# Patient Record
Sex: Male | Born: 1960 | Race: White | Hispanic: No | Marital: Single | Smoking: Current every day smoker
Health system: Southern US, Community
[De-identification: ages and names within clinical notes are randomized; demographics above are authoritative.]

## PROBLEM LIST (undated history)

## (undated) DIAGNOSIS — F1020 Alcohol dependence, uncomplicated: Secondary | ICD-10-CM

## (undated) DIAGNOSIS — F1920 Other psychoactive substance dependence, uncomplicated: Secondary | ICD-10-CM

## (undated) HISTORY — PX: CERVICAL SPINE SURGERY: SHX589

## (undated) HISTORY — PX: FOOT SURGERY: SHX648

## (undated) HISTORY — PX: WRIST ARTHROPLASTY: SHX1088

## (undated) HISTORY — PX: KNEE ARTHROSCOPY: SUR90

---

## 2013-05-26 DIAGNOSIS — K8010 Calculus of gallbladder with chronic cholecystitis without obstruction: Principal | ICD-10-CM | POA: Insufficient documentation

## 2013-05-26 DIAGNOSIS — K7689 Other specified diseases of liver: Secondary | ICD-10-CM | POA: Insufficient documentation

## 2013-05-26 DIAGNOSIS — K429 Umbilical hernia without obstruction or gangrene: Secondary | ICD-10-CM | POA: Insufficient documentation

## 2013-05-26 DIAGNOSIS — F17200 Nicotine dependence, unspecified, uncomplicated: Secondary | ICD-10-CM | POA: Insufficient documentation

## 2013-05-26 DIAGNOSIS — Z981 Arthrodesis status: Secondary | ICD-10-CM | POA: Insufficient documentation

## 2013-05-26 DIAGNOSIS — K4090 Unilateral inguinal hernia, without obstruction or gangrene, not specified as recurrent: Secondary | ICD-10-CM | POA: Insufficient documentation

## 2013-05-27 ENCOUNTER — Observation Stay (HOSPITAL_COMMUNITY): Payer: No Typology Code available for payment source | Admitting: Certified Registered Nurse Anesthetist

## 2013-05-27 ENCOUNTER — Encounter (HOSPITAL_COMMUNITY): Payer: Self-pay | Admitting: Emergency Medicine

## 2013-05-27 ENCOUNTER — Encounter (HOSPITAL_COMMUNITY): Payer: No Typology Code available for payment source | Admitting: Certified Registered Nurse Anesthetist

## 2013-05-27 ENCOUNTER — Observation Stay (HOSPITAL_COMMUNITY): Payer: No Typology Code available for payment source

## 2013-05-27 ENCOUNTER — Emergency Department (HOSPITAL_COMMUNITY): Payer: No Typology Code available for payment source

## 2013-05-27 ENCOUNTER — Observation Stay (HOSPITAL_COMMUNITY)
Admission: EM | Admit: 2013-05-27 | Discharge: 2013-05-27 | Disposition: A | Discharge status: Home / Self Care | Payer: No Typology Code available for payment source | Attending: Surgery | Admitting: Surgery

## 2013-05-27 ENCOUNTER — Encounter (HOSPITAL_COMMUNITY)
Admission: EM | Disposition: A | Discharge status: Home / Self Care | Payer: Self-pay | Source: Home / Self Care | Attending: Emergency Medicine

## 2013-05-27 DIAGNOSIS — K7581 Nonalcoholic steatohepatitis (NASH): Secondary | ICD-10-CM | POA: Diagnosis present

## 2013-05-27 DIAGNOSIS — R112 Nausea with vomiting, unspecified: Secondary | ICD-10-CM

## 2013-05-27 DIAGNOSIS — K812 Acute cholecystitis with chronic cholecystitis: Secondary | ICD-10-CM

## 2013-05-27 DIAGNOSIS — K810 Acute cholecystitis: Secondary | ICD-10-CM

## 2013-05-27 DIAGNOSIS — K8020 Calculus of gallbladder without cholecystitis without obstruction: Secondary | ICD-10-CM

## 2013-05-27 DIAGNOSIS — R109 Unspecified abdominal pain: Secondary | ICD-10-CM

## 2013-05-27 DIAGNOSIS — K4090 Unilateral inguinal hernia, without obstruction or gangrene, not specified as recurrent: Secondary | ICD-10-CM

## 2013-05-27 DIAGNOSIS — K8010 Calculus of gallbladder with chronic cholecystitis without obstruction: Secondary | ICD-10-CM

## 2013-05-27 HISTORY — DX: Alcohol dependence, uncomplicated: F10.20

## 2013-05-27 HISTORY — PX: CHOLECYSTECTOMY: SHX55

## 2013-05-27 HISTORY — DX: Other psychoactive substance dependence, uncomplicated: F19.20

## 2013-05-27 LAB — CBC WITH DIFFERENTIAL/PLATELET
BASOS ABS: 0.1 10*3/uL (ref 0.0–0.1)
BASOS PCT: 0 % (ref 0–1)
Eosinophils Absolute: 0.1 10*3/uL (ref 0.0–0.7)
Eosinophils Relative: 1 % (ref 0–5)
HEMATOCRIT: 49.3 % (ref 39.0–52.0)
Hemoglobin: 17.8 g/dL — ABNORMAL HIGH (ref 13.0–17.0)
LYMPHS PCT: 9 % — AB (ref 12–46)
Lymphs Abs: 1.7 10*3/uL (ref 0.7–4.0)
MCH: 31 pg (ref 26.0–34.0)
MCHC: 36.1 g/dL — AB (ref 30.0–36.0)
MCV: 85.7 fL (ref 78.0–100.0)
MONO ABS: 0.6 10*3/uL (ref 0.1–1.0)
Monocytes Relative: 3 % (ref 3–12)
NEUTROS ABS: 15.8 10*3/uL — AB (ref 1.7–7.7)
NEUTROS PCT: 87 % — AB (ref 43–77)
Platelets: 297 10*3/uL (ref 150–400)
RBC: 5.75 MIL/uL (ref 4.22–5.81)
RDW: 14.1 % (ref 11.5–15.5)
WBC: 18.3 10*3/uL — AB (ref 4.0–10.5)

## 2013-05-27 LAB — URINALYSIS, ROUTINE W REFLEX MICROSCOPIC
Bilirubin Urine: NEGATIVE
Glucose, UA: NEGATIVE mg/dL
HGB URINE DIPSTICK: NEGATIVE
Ketones, ur: NEGATIVE mg/dL
Leukocytes, UA: NEGATIVE
Nitrite: NEGATIVE
PH: 5.5 (ref 5.0–8.0)
Protein, ur: NEGATIVE mg/dL
SPECIFIC GRAVITY, URINE: 1.021 (ref 1.005–1.030)
UROBILINOGEN UA: 0.2 mg/dL (ref 0.0–1.0)

## 2013-05-27 LAB — COMPREHENSIVE METABOLIC PANEL
ALT: 53 U/L (ref 0–53)
AST: 40 U/L — AB (ref 0–37)
Albumin: 4.6 g/dL (ref 3.5–5.2)
Alkaline Phosphatase: 80 U/L (ref 39–117)
BILIRUBIN TOTAL: 0.3 mg/dL (ref 0.3–1.2)
BUN: 13 mg/dL (ref 6–23)
CHLORIDE: 102 meq/L (ref 96–112)
CO2: 22 meq/L (ref 19–32)
CREATININE: 0.98 mg/dL (ref 0.50–1.35)
Calcium: 9.9 mg/dL (ref 8.4–10.5)
GFR calc Af Amer: >90 mL/min (ref >90)
GFR calc non Af Amer: >90 mL/min (ref >90)
Glucose, Bld: 138 mg/dL — ABNORMAL HIGH (ref 70–99)
POTASSIUM: 4.2 meq/L (ref 3.7–5.3)
Sodium: 142 mEq/L (ref 137–147)
Total Protein: 7.7 g/dL (ref 6.0–8.3)

## 2013-05-27 LAB — LIPASE, BLOOD: Lipase: 55 U/L (ref 11–59)

## 2013-05-27 LAB — I-STAT TROPONIN, ED: TROPONIN I, POC: 0.01 ng/mL (ref 0.00–0.08)

## 2013-05-27 LAB — SURGICAL PCR SCREEN
MRSA, PCR: NEGATIVE
Staphylococcus aureus: POSITIVE — AB

## 2013-05-27 SURGERY — LAPAROSCOPIC CHOLECYSTECTOMY WITH INTRAOPERATIVE CHOLANGIOGRAM
Anesthesia: General

## 2013-05-27 MED ORDER — ROCURONIUM BROMIDE 100 MG/10ML IV SOLN
INTRAVENOUS | Status: AC
  Filled 2013-05-27: qty 1

## 2013-05-27 MED ORDER — TRAMADOL HCL 50 MG PO TABS: 50.0000 mg | ORAL_TABLET | Freq: Four times a day (QID) | ORAL | Status: DC | PRN

## 2013-05-27 MED ORDER — LIDOCAINE HCL (CARDIAC) 20 MG/ML IV SOLN
INTRAVENOUS | Status: DC | PRN
  Administered 2013-05-27: 100 mg via INTRAVENOUS

## 2013-05-27 MED ORDER — SUCCINYLCHOLINE CHLORIDE 20 MG/ML IJ SOLN
INTRAMUSCULAR | Status: DC | PRN
  Administered 2013-05-27: 140 mg via INTRAVENOUS

## 2013-05-27 MED ORDER — ONDANSETRON HCL 4 MG/2ML IJ SOLN: 4.0000 mg | Freq: Four times a day (QID) | INTRAMUSCULAR | Status: DC | PRN

## 2013-05-27 MED ORDER — ONDANSETRON HCL 4 MG/2ML IJ SOLN
INTRAMUSCULAR | Status: AC
  Filled 2013-05-27: qty 2

## 2013-05-27 MED ORDER — LIP MEDEX EX OINT
1.0000 "application " | TOPICAL_OINTMENT | Freq: Two times a day (BID) | CUTANEOUS | Status: DC
  Filled 2013-05-27: qty 7

## 2013-05-27 MED ORDER — ONDANSETRON HCL 4 MG/2ML IJ SOLN
INTRAMUSCULAR | Status: DC | PRN
  Administered 2013-05-27: 4 mg via INTRAVENOUS

## 2013-05-27 MED ORDER — LACTATED RINGERS IR SOLN
Status: DC | PRN
  Administered 2013-05-27 (×2): 300 mL

## 2013-05-27 MED ORDER — MEPERIDINE HCL 50 MG/ML IJ SOLN: 6.2500 mg | INTRAMUSCULAR | Status: DC | PRN

## 2013-05-27 MED ORDER — SACCHAROMYCES BOULARDII 250 MG PO CAPS
250.0000 mg | ORAL_CAPSULE | Freq: Two times a day (BID) | ORAL | Status: DC
  Filled 2013-05-27 (×2): qty 1

## 2013-05-27 MED ORDER — GLYCOPYRROLATE 0.2 MG/ML IJ SOLN
INTRAMUSCULAR | Status: DC | PRN
  Administered 2013-05-27: .8 mg via INTRAVENOUS

## 2013-05-27 MED ORDER — IOHEXOL 300 MG/ML  SOLN
INTRAMUSCULAR | Status: DC | PRN
Start: 2013-05-27 — End: 2013-05-27
  Administered 2013-05-27: 10 mL

## 2013-05-27 MED ORDER — DEXAMETHASONE SODIUM PHOSPHATE 10 MG/ML IJ SOLN
INTRAMUSCULAR | Status: DC | PRN
  Administered 2013-05-27: 10 mg via INTRAVENOUS

## 2013-05-27 MED ORDER — PROPOFOL 10 MG/ML IV BOLUS
INTRAVENOUS | Status: DC | PRN
  Administered 2013-05-27: 200 mg via INTRAVENOUS

## 2013-05-27 MED ORDER — ONDANSETRON HCL 4 MG/2ML IJ SOLN
4.0000 mg | Freq: Once | INTRAMUSCULAR | Status: AC
  Administered 2013-05-27: 4 mg via INTRAVENOUS
  Filled 2013-05-27: qty 2

## 2013-05-27 MED ORDER — GLYCOPYRROLATE 0.2 MG/ML IJ SOLN
INTRAMUSCULAR | Status: AC
  Filled 2013-05-27: qty 3

## 2013-05-27 MED ORDER — LACTATED RINGERS IV SOLN
INTRAVENOUS | Status: DC
  Administered 2013-05-27: 1000 mL via INTRAVENOUS

## 2013-05-27 MED ORDER — DEXAMETHASONE SODIUM PHOSPHATE 10 MG/ML IJ SOLN
INTRAMUSCULAR | Status: AC
  Filled 2013-05-27: qty 1

## 2013-05-27 MED ORDER — OXYCODONE HCL 5 MG/5ML PO SOLN
5.0000 mg | Freq: Once | ORAL | Status: DC | PRN
  Filled 2013-05-27: qty 5

## 2013-05-27 MED ORDER — KCL IN DEXTROSE-NACL 40-5-0.45 MEQ/L-%-% IV SOLN
INTRAVENOUS | Status: DC
  Administered 2013-05-27: 08:00:00 via INTRAVENOUS
  Filled 2013-05-27: qty 1000

## 2013-05-27 MED ORDER — KETAMINE HCL 10 MG/ML IJ SOLN
INTRAMUSCULAR | Status: AC
  Filled 2013-05-27: qty 1

## 2013-05-27 MED ORDER — MIDAZOLAM HCL 2 MG/2ML IJ SOLN
INTRAMUSCULAR | Status: AC
  Filled 2013-05-27: qty 2

## 2013-05-27 MED ORDER — ALUM & MAG HYDROXIDE-SIMETH 200-200-20 MG/5ML PO SUSP: 30.0000 mL | Freq: Four times a day (QID) | ORAL | Status: DC | PRN

## 2013-05-27 MED ORDER — BUPIVACAINE-EPINEPHRINE 0.25% -1:200000 IJ SOLN
INTRAMUSCULAR | Status: AC
  Filled 2013-05-27: qty 1

## 2013-05-27 MED ORDER — FENTANYL CITRATE 0.05 MG/ML IJ SOLN: 25.0000 ug | INTRAMUSCULAR | Status: DC | PRN

## 2013-05-27 MED ORDER — KETOROLAC TROMETHAMINE 30 MG/ML IJ SOLN
INTRAMUSCULAR | Status: AC
  Filled 2013-05-27: qty 1

## 2013-05-27 MED ORDER — TRAMADOL HCL 50 MG PO TABS
50.0000 mg | ORAL_TABLET | Freq: Once | ORAL | Status: AC
  Administered 2013-05-27: 50 mg via ORAL
  Filled 2013-05-27: qty 1

## 2013-05-27 MED ORDER — ACETAMINOPHEN 650 MG RE SUPP: 650.0000 mg | Freq: Four times a day (QID) | RECTAL | Status: DC | PRN

## 2013-05-27 MED ORDER — FENTANYL CITRATE 0.05 MG/ML IJ SOLN
INTRAMUSCULAR | Status: AC
  Filled 2013-05-27: qty 2

## 2013-05-27 MED ORDER — ACETAMINOPHEN 10 MG/ML IV SOLN
1000.0000 mg | Freq: Once | INTRAVENOUS | Status: AC
  Administered 2013-05-27: 1000 mg via INTRAVENOUS
  Filled 2013-05-27: qty 100

## 2013-05-27 MED ORDER — ACETAMINOPHEN 325 MG PO TABS: 650.0000 mg | ORAL_TABLET | Freq: Four times a day (QID) | ORAL | Status: DC | PRN

## 2013-05-27 MED ORDER — NEOSTIGMINE METHYLSULFATE 1 MG/ML IJ SOLN
INTRAMUSCULAR | Status: AC
  Filled 2013-05-27: qty 10

## 2013-05-27 MED ORDER — DIPHENHYDRAMINE HCL 50 MG/ML IJ SOLN: 12.5000 mg | Freq: Four times a day (QID) | INTRAMUSCULAR | Status: DC | PRN

## 2013-05-27 MED ORDER — FENTANYL CITRATE 0.05 MG/ML IJ SOLN
INTRAMUSCULAR | Status: AC
  Filled 2013-05-27: qty 5

## 2013-05-27 MED ORDER — BUPIVACAINE-EPINEPHRINE 0.25% -1:200000 IJ SOLN
INTRAMUSCULAR | Status: DC | PRN
  Administered 2013-05-27: 27 mL

## 2013-05-27 MED ORDER — NEOSTIGMINE METHYLSULFATE 1 MG/ML IJ SOLN
INTRAMUSCULAR | Status: DC | PRN
  Administered 2013-05-27: 5 mg via INTRAVENOUS

## 2013-05-27 MED ORDER — OXYCODONE HCL 5 MG PO TABS: 5.0000 mg | ORAL_TABLET | Freq: Once | ORAL | Status: DC | PRN

## 2013-05-27 MED ORDER — CHLORHEXIDINE GLUCONATE CLOTH 2 % EX PADS: 6.0000 | MEDICATED_PAD | Freq: Every day | CUTANEOUS | Status: DC

## 2013-05-27 MED ORDER — ESMOLOL HCL 10 MG/ML IV SOLN
INTRAVENOUS | Status: AC
Start: 2013-05-27 — End: 2013-05-27
  Filled 2013-05-27: qty 10

## 2013-05-27 MED ORDER — LIDOCAINE HCL (CARDIAC) 20 MG/ML IV SOLN
INTRAVENOUS | Status: AC
  Filled 2013-05-27: qty 5

## 2013-05-27 MED ORDER — HYDROMORPHONE HCL PF 1 MG/ML IJ SOLN: 0.2500 mg | INTRAMUSCULAR | Status: DC | PRN

## 2013-05-27 MED ORDER — KETOROLAC TROMETHAMINE 30 MG/ML IJ SOLN
30.0000 mg | Freq: Once | INTRAMUSCULAR | Status: AC
  Administered 2013-05-27: 30 mg via INTRAVENOUS
  Filled 2013-05-27: qty 1

## 2013-05-27 MED ORDER — SODIUM CHLORIDE 0.9 % IV SOLN
3.0000 g | Freq: Four times a day (QID) | INTRAVENOUS | Status: DC
  Administered 2013-05-27 (×2): 3 g via INTRAVENOUS
  Filled 2013-05-27 (×5): qty 3

## 2013-05-27 MED ORDER — ESMOLOL HCL 10 MG/ML IV SOLN
INTRAVENOUS | Status: DC | PRN
  Administered 2013-05-27: 20 mg via INTRAVENOUS
  Administered 2013-05-27: 10 mg via INTRAVENOUS
  Administered 2013-05-27: 20 mg via INTRAVENOUS
  Administered 2013-05-27: 10 mg via INTRAVENOUS
  Administered 2013-05-27: 30 mg via INTRAVENOUS
  Administered 2013-05-27: 10 mg via INTRAVENOUS

## 2013-05-27 MED ORDER — HEPARIN SODIUM (PORCINE) 5000 UNIT/ML IJ SOLN
5000.0000 [IU] | Freq: Three times a day (TID) | INTRAMUSCULAR | Status: DC
  Filled 2013-05-27: qty 1

## 2013-05-27 MED ORDER — LACTATED RINGERS IV BOLUS (SEPSIS)
1000.0000 mL | Freq: Once | INTRAVENOUS | Status: DC
Start: 2013-05-27 — End: 2013-05-27

## 2013-05-27 MED ORDER — PROPOFOL 10 MG/ML IV BOLUS
INTRAVENOUS | Status: AC
  Filled 2013-05-27: qty 20

## 2013-05-27 MED ORDER — MAGIC MOUTHWASH
15.0000 mL | Freq: Four times a day (QID) | ORAL | Status: DC | PRN
  Filled 2013-05-27: qty 15

## 2013-05-27 MED ORDER — KETAMINE HCL 10 MG/ML IJ SOLN
INTRAMUSCULAR | Status: DC | PRN
  Administered 2013-05-27: 30 mg via INTRAVENOUS

## 2013-05-27 MED ORDER — PROMETHAZINE HCL 25 MG/ML IJ SOLN
6.2500 mg | INTRAMUSCULAR | Status: DC | PRN
Start: 2013-05-27 — End: 2013-05-27

## 2013-05-27 MED ORDER — KETOROLAC TROMETHAMINE 15 MG/ML IJ SOLN: 15.0000 mg | Freq: Four times a day (QID) | INTRAMUSCULAR | Status: DC | PRN

## 2013-05-27 MED ORDER — KETOROLAC TROMETHAMINE 30 MG/ML IJ SOLN
INTRAMUSCULAR | Status: DC | PRN
  Administered 2013-05-27: 30 mg via INTRAVENOUS

## 2013-05-27 MED ORDER — BISACODYL 10 MG RE SUPP: 10.0000 mg | Freq: Two times a day (BID) | RECTAL | Status: DC | PRN

## 2013-05-27 MED ORDER — ROCURONIUM BROMIDE 100 MG/10ML IV SOLN
INTRAVENOUS | Status: DC | PRN
  Administered 2013-05-27: 40 mg via INTRAVENOUS

## 2013-05-27 MED ORDER — POLYETHYLENE GLYCOL 3350 17 G PO PACK
17.0000 g | PACK | Freq: Two times a day (BID) | ORAL | Status: DC
  Filled 2013-05-27 (×2): qty 1

## 2013-05-27 MED ORDER — DIPHENHYDRAMINE HCL 12.5 MG/5ML PO ELIX: 12.5000 mg | ORAL_SOLUTION | Freq: Four times a day (QID) | ORAL | Status: DC | PRN

## 2013-05-27 MED ORDER — LACTATED RINGERS IV BOLUS (SEPSIS): 1000.0000 mL | Freq: Three times a day (TID) | INTRAVENOUS | Status: DC | PRN

## 2013-05-27 MED ORDER — SODIUM CHLORIDE 0.9 % IV BOLUS (SEPSIS)
1000.0000 mL | Freq: Once | INTRAVENOUS | Status: AC
  Administered 2013-05-27: 1000 mL via INTRAVENOUS

## 2013-05-27 MED ORDER — PROMETHAZINE HCL 25 MG/ML IJ SOLN
6.2500 mg | Freq: Four times a day (QID) | INTRAMUSCULAR | Status: DC | PRN
  Filled 2013-05-27: qty 1

## 2013-05-27 MED ORDER — KCL IN DEXTROSE-NACL 20-5-0.45 MEQ/L-%-% IV SOLN
INTRAVENOUS | Status: DC
  Administered 2013-05-27: 07:00:00 via INTRAVENOUS
  Filled 2013-05-27 (×3): qty 1000

## 2013-05-27 MED ORDER — MUPIROCIN 2 % EX OINT
1.0000 "application " | TOPICAL_OINTMENT | Freq: Two times a day (BID) | CUTANEOUS | Status: DC
  Administered 2013-05-27: 1 via NASAL
  Filled 2013-05-27: qty 22

## 2013-05-27 MED ORDER — METOPROLOL TARTRATE 12.5 MG HALF TABLET
12.5000 mg | ORAL_TABLET | Freq: Two times a day (BID) | ORAL | Status: DC | PRN
  Filled 2013-05-27: qty 1

## 2013-05-27 SURGICAL SUPPLY — 41 items
ADH SKN CLS APL DERMABOND .7 (GAUZE/BANDAGES/DRESSINGS) ×1
APPLIER CLIP ROT 10 11.4 M/L (STAPLE) ×3
APR CLP MED LRG 11.4X10 (STAPLE) ×1
BAG SPEC RTRVL LRG 6X4 10 (ENDOMECHANICALS) ×1
CANISTER SUCTION 2500CC (MISCELLANEOUS) ×1 IMPLANT
CATH REDDICK CHOLANGI 4FR 50CM (CATHETERS) ×3 IMPLANT
CHLORAPREP W/TINT 26ML (MISCELLANEOUS) ×3 IMPLANT
CLIP APPLIE ROT 10 11.4 M/L (STAPLE) ×1 IMPLANT
COVER MAYO STAND STRL (DRAPES) ×3 IMPLANT
DECANTER SPIKE VIAL GLASS SM (MISCELLANEOUS) ×3 IMPLANT
DERMABOND ADVANCED (GAUZE/BANDAGES/DRESSINGS) ×2
DERMABOND ADVANCED .7 DNX12 (GAUZE/BANDAGES/DRESSINGS) ×1 IMPLANT
DRAPE C-ARM 42X120 X-RAY (DRAPES) ×3 IMPLANT
DRAPE LAPAROSCOPIC ABDOMINAL (DRAPES) ×3 IMPLANT
ELECT REM PT RETURN 9FT ADLT (ELECTROSURGICAL) ×3
ELECTRODE REM PT RTRN 9FT ADLT (ELECTROSURGICAL) ×1 IMPLANT
GLOVE BIO SURGEON STRL SZ7.5 (GLOVE) ×6 IMPLANT
GLOVE BIOGEL PI IND STRL 6.5 (GLOVE) IMPLANT
GLOVE BIOGEL PI INDICATOR 6.5 (GLOVE) ×8
GOWN PREVENTION PLUS XLARGE (GOWN DISPOSABLE) ×2 IMPLANT
GOWN STRL NON-REIN LRG LVL3 (GOWN DISPOSABLE) ×2 IMPLANT
GOWN STRL REIN XL XLG (GOWN DISPOSABLE) ×2 IMPLANT
GOWN STRL REUS W/ TWL XL LVL3 (GOWN DISPOSABLE) IMPLANT
GOWN STRL REUS W/TWL LRG LVL3 (GOWN DISPOSABLE) ×3 IMPLANT
GOWN STRL REUS W/TWL XL LVL3 (GOWN DISPOSABLE) ×8 IMPLANT
HEMOSTAT SURGICEL 4X8 (HEMOSTASIS) ×3 IMPLANT
IV CATH 14GX2 1/4 (CATHETERS) ×3 IMPLANT
KIT BASIN OR (CUSTOM PROCEDURE TRAY) ×3 IMPLANT
MANIFOLD NEPTUNE II (INSTRUMENTS) ×2 IMPLANT
POUCH SPECIMEN RETRIEVAL 10MM (ENDOMECHANICALS) ×2 IMPLANT
SET IRRIG TUBING LAPAROSCOPIC (IRRIGATION / IRRIGATOR) ×3 IMPLANT
SOLUTION ANTI FOG 6CC (MISCELLANEOUS) ×3 IMPLANT
SUT MNCRL AB 4-0 PS2 18 (SUTURE) ×3 IMPLANT
TOWEL OR 17X26 10 PK STRL BLUE (TOWEL DISPOSABLE) ×3 IMPLANT
TOWEL OR NON WOVEN STRL DISP B (DISPOSABLE) ×3 IMPLANT
TRAY LAP CHOLE (CUSTOM PROCEDURE TRAY) ×3 IMPLANT
TROCAR BLADELESS OPT 5 75 (ENDOMECHANICALS) ×3 IMPLANT
TROCAR SLEEVE XCEL 5X75 (ENDOMECHANICALS) ×3 IMPLANT
TROCAR XCEL BLUNT TIP 100MML (ENDOMECHANICALS) ×3 IMPLANT
TROCAR XCEL NON-BLD 11X100MML (ENDOMECHANICALS) ×3 IMPLANT
TUBING INSUFFLATION 10FT LAP (TUBING) ×3 IMPLANT

## 2013-05-27 NOTE — Anesthesia Preprocedure Evaluation (Addendum)
Anesthesia Evaluation  Patient identified by MRN, date of birth, ID band Patient awake    Reviewed: Allergy & Precautions, H&P , NPO status , Patient's Chart, lab work & pertinent test results  Airway Mallampati: II TM Distance: >3 FB     Dental  (+) Dental Advisory Given   Pulmonary neg pulmonary ROS, Current Smoker,  breath sounds clear to auscultation        Cardiovascular negative cardio ROS  Rhythm:Regular Rate:Normal     Neuro/Psych negative neurological ROS  negative psych ROS   GI/Hepatic negative GI ROS, Neg liver ROS, (+)     substance abuse  alcohol use,   Endo/Other  negative endocrine ROS  Renal/GU negative Renal ROS     Musculoskeletal negative musculoskeletal ROS (+)   Abdominal   Peds  Hematology negative hematology ROS (+)   Anesthesia Other Findings   Reproductive/Obstetrics negative OB ROS                          Anesthesia Physical Anesthesia Plan  ASA: II  Anesthesia Plan: General   Post-op Pain Management:    Induction: Intravenous  Airway Management Planned: Oral ETT  Additional Equipment:   Intra-op Plan:   Post-operative Plan: Extubation in OR  Informed Consent: I have reviewed the patients History and Physical, chart, labs and discussed the procedure including the risks, benefits and alternatives for the proposed anesthesia with the patient or authorized representative who has indicated his/her understanding and acceptance.   Dental advisory given  Plan Discussed with: CRNA  Anesthesia Plan Comments:         Anesthesia Quick Evaluation

## 2013-05-27 NOTE — ED Notes (Signed)
Pt states that he started having Right and Left upper quadrant pain, vomiting and back pain approx. 5 hours ago. States that he has been told in the past that he had gallstones but has never had trouble with them.

## 2013-05-27 NOTE — Progress Notes (Signed)
  Subjective: No complaints now. He feels much better than earlier  Objective: Vital signs in last 24 hours: Temp:  [97.5 F (36.4 C)-97.7 F (36.5 C)] 97.7 F (36.5 C) (03/24 0818) Pulse Rate:  [50-64] 64 (03/24 0818) Resp:  [16-20] 18 (03/24 0818) BP: (123-170)/(59-101) 130/78 mmHg (03/24 0818) SpO2:  [95 %-98 %] 96 % (03/24 0818) Weight:  [235 lb (106.595 kg)] 235 lb (106.595 kg) (03/24 0818)    Intake/Output from previous day:   Intake/Output this shift:    Resp: clear to auscultation bilaterally Cardio: regular rate and rhythm GI: soft, nontender  Lab Results:   Recent Labs  05/27/13 0038  WBC 18.3*  HGB 17.8*  HCT 49.3  PLT 297   BMET  Recent Labs  05/27/13 0038  NA 142  K 4.2  CL 102  CO2 22  GLUCOSE 138*  BUN 13  CREATININE 0.98  CALCIUM 9.9   PT/INR No results found for this basename: LABPROT, INR,  in the last 72 hours ABG No results found for this basename: PHART, PCO2, PO2, HCO3,  in the last 72 hours  Studies/Results: [place] Abdomen Limited  05/27/2013   CLINICAL DATA:  Right upper quadrant abdominal pain.  Emesis.  EXAM: US ABDOMEN LIMITED - RIGHT UPPER QUADRANT  COMPARISON:  No priors.  FINDINGS: Gallbladder:  Multiple echogenic foci with posterior acoustic shadowing wing dependently within the gallbladder, compatible with gallstones, largest of which measures up to 1.5 cm in diameter. Gallbladder wall thickness is normal at 2 to 3 mm. Gallbladder appears only moderately distended. No abnormal pericholecystic fluid. Per report from the sonographer, the patient did not exhibit a sonographic Murphy's sign on examination.  Common bile duct:  Diameter: 4 mm in the porta hepatis.  Liver:  Diffusely increased echogenicity throughout the hepatic parenchyma, suggestive of hepatic steatosis. No focal cystic or solid hepatic lesions. No intrahepatic biliary ductal dilatation. Normal hepatopetal flow in the portal vein.  IMPRESSION: 1. Study is positive for  cholelithiasis, but there are no findings to suggest acute cholecystitis at this time. 2. Probable hepatic steatosis.   Electronically Signed   By: Trudie Reedaniel  Entrikin M.D.   On: 05/27/2013 05:14    Anti-infectives: Anti-infectives   Start     Dose/Rate Route Frequency Ordered Stop   05/27/13 0800  Ampicillin-Sulbactam (UNASYN) 3 g in sodium chloride 0.9 % 100 mL IVPB     3 g 100 mL/hr over 60 Minutes Intravenous Every 6 hours 05/27/13 0708        Assessment/Plan: s/p Procedure(s): LAPAROSCOPIC CHOLECYSTECTOMY WITH INTRAOPERATIVE CHOLANGIOGRAM (N/A) plan for lap chole later today. Risks and benefits of surgery discussed with the pt as well as some of the technical aspects and he understands and wishes to proceed  LOS: 0 days    TOTH III,Taiwo Fish S 05/27/2013

## 2013-05-27 NOTE — Progress Notes (Signed)
Pt arrived from PACU on bed, A&Ox4. Abd dermabond x 4 d/i. Denies pain/nausea at present. VSS. IVF infusing. Pt has d/c orders for home. Orders clarified w/ Dr Abbey Chattersosenbower.  Pt d/c home after he has tolerated sips of clear liquids, has voided, and has ambulated. Pt aware and agreeable. Sister at bedside.

## 2013-05-27 NOTE — ED Notes (Signed)
Pt aware of the need for a urine sample, urinal at bedside. 

## 2013-05-27 NOTE — Transfer of Care (Signed)
Immediate Anesthesia Transfer of Care Note  Patient: Shawn Hartman  Procedure(s) Performed: Procedure(s) (LRB): LAPAROSCOPIC CHOLECYSTECTOMY WITH INTRAOPERATIVE CHOLANGIOGRAM (N/A)  Patient Location: PACU  Anesthesia Type: General  Level of Consciousness: sedated, patient cooperative and responds to stimulation  Airway & Oxygen Therapy: Patient Spontanous Breathing and Patient connected to face mask oxgen  Post-op Assessment: Report given to PACU RN and Post -op Vital signs reviewed and stable  Post vital signs: Reviewed and stable  Complications: No apparent anesthesia complications

## 2013-05-27 NOTE — H&P (Addendum)
[place], MD, [address] [place], [place], [place] [address] Phone: [phone] FAX: [phone]     Yoltzin Barg  [birth year] [id number]  CARE TEAM:  PCP: No primary provider on file.  Outpatient Care Team: Patient has no care team.  Inpatient Treatment Team: Treatment Team: Attending Provider: Elyn Peers, MD; Technician: Sharma Covert, EMT; Registered Nurse: Susette Racer, RN; Registered Nurse: Waunita Schooner, RN; Consulting Physician: Nolon Nations, MD  This patient is a 53 y.o.male who presents today for surgical evaluation at the request of Dr Cheri Guppy, Dirk Dress ED MD.   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  Smokes half a pack a day.  No history of bowel problems.  Had an episode of severe upper abdominal pain and nausea vomiting last month.  Happened at night.  Resolved rather quickly.  Had another episode last night.  Far more intense.  No relief with ginger, Thoms, Pepto-Bismol.  [facility] emergency room.  Soreness and upper abdomen.  Initially started at back.  Primarily epigastric.  No hematemesis.  No sick contacts.  No travel history.   He normally has 2-3 bowel movements a day.  No change in bowel habits.  No personal nor family history of GI/colon cancer, inflammatory bowel disease, irritable bowel syndrome, allergy such as Celiac Sprue, dietary/dairy problems, colitis, ulcers nor gastritis.  No recent sick contacts/gastroenteritis.  No travel outside the country.  No changes in diet.  No dysphagia to solids or liquids.  No significant heartburn or reflux.  No hematochezia, hematemesis, coffee ground emesis.  No evidence of prior gastric/peptic ulceration.    No past medical history on file.  Past Surgical History  Procedure Laterality Date  . Wrist arthroplasty    . Cervical spine  surgery      C3-5 FUSED. bROKEN NECK  . Foot surgery Right   . Knee arthroscopy Bilateral     MULTIPLE    History   Social History  . Marital Status: Single    Spouse Name: N/A    Number of Children: N/A  . Years of Education: N/A   Occupational History  . Not on file.   Social History Main Topics  . Smoking status: Current Every Day Smoker -- 0.25 packs/day  . Smokeless tobacco: Not on file  . Alcohol Use: No     Comment: Recovering alcoholic  . Drug Use: No  . Sexual Activity: Not on file   Other Topics Concern  . Not on file   Social History Narrative  . No narrative on file    No family history on file.  No current facility-administered medications for this encounter.   No current outpatient prescriptions on file.     No Known Allergies  ROS: Constitutional:  No fevers, chills, sweats.  Weight stable Eyes:  No vision changes, No discharge HENT:  No sore throats, nasal drainage Lymph: No neck swelling, No bruising easily Pulmonary:  No cough, productive sputum CV: No orthopnea, PND  Patient walks 30 minutes for about 1 miles without difficulty.  No exertional chest/neck/shoulder/arm pain. GI: No personal nor family history of GI/colon cancer, inflammatory bowel disease, irritable bowel syndrome, allergy such as Celiac Sprue, dietary/dairy problems, colitis, ulcers nor gastritis.  No recent sick contacts/gastroenteritis.  No travel outside the country.  No changes in diet. Renal: No UTIs, No  hematuria Genital:  No drainage, bleeding, masses Musculoskeletal: No severe joint pain.  Good ROM major joints Skin:  No sores or lesions.  No rashes Heme/Lymph:  No easy bleeding.  No swollen lymph nodes Neuro: No focal weakness/numbness.  No seizures Psych: No suicidal ideation.  No hallucinations  BP 142/75  Pulse 50  Temp(Src) 97.5 F (36.4 C) (Oral)  Resp 16  SpO2 98%  Physical Exam: General: Pt awake/alert/oriented x4 in no major acute distress Eyes:  PERRL, normal EOM. Sclera nonicteric Neuro: CN II-XII intact w/o focal sensory/motor deficits. Lymph: No head/neck/groin lymphadenopathy Psych:  No delerium/psychosis/paranoia HENT: Normocephalic, Mucus membranes moist.  No thrush Neck: Supple, No tracheal deviation Chest: No pain.  Good respiratory excursion. CV:  Pulses intact.  Regular rhythm Abdomen: Soft, Nondistended.  Mild/mod TTP RUQ w Murphy's sign.  No incarcerated hernias. Ext:  SCDs BLE.  No significant edema.  No cyanosis Skin: No petechiae / purpurea.  No major sores Musculoskeletal: No severe joint pain.  Good ROM major joints   Results:   Labs: Results for orders placed during the hospital encounter of 05/27/13 (from the past 48 hour(s))  CBC WITH DIFFERENTIAL     Status: Abnormal   Collection Time    05/27/13 12:38 AM      Result Value Ref Range   WBC 18.3 (*) 4.0 - 10.5 K/uL   RBC 5.75  4.22 - 5.81 MIL/uL   Hemoglobin 17.8 (*) 13.0 - 17.0 g/dL   HCT 49.3  39.0 - 52.0 %   MCV 85.7  78.0 - 100.0 fL   MCH 31.0  26.0 - 34.0 pg   MCHC 36.1 (*) 30.0 - 36.0 g/dL   RDW 14.1  11.5 - 15.5 %   Platelets 297  150 - 400 K/uL   Neutrophils Relative % 87 (*) 43 - 77 %   Neutro Abs 15.8 (*) 1.7 - 7.7 K/uL   Lymphocytes Relative 9 (*) 12 - 46 %   Lymphs Abs 1.7  0.7 - 4.0 K/uL   Monocytes Relative 3  3 - 12 %   Monocytes Absolute 0.6  0.1 - 1.0 K/uL   Eosinophils Relative 1  0 - 5 %   Eosinophils Absolute 0.1  0.0 - 0.7 K/uL   Basophils Relative 0  0 - 1 %   Basophils Absolute 0.1  0.0 - 0.1 K/uL  COMPREHENSIVE METABOLIC PANEL     Status: Abnormal   Collection Time    05/27/13 12:38 AM      Result Value Ref Range   Sodium 142  137 - 147 mEq/L   Potassium 4.2  3.7 - 5.3 mEq/L   Chloride 102  96 - 112 mEq/L   CO2 22  19 - 32 mEq/L   Glucose, Bld 138 (*) 70 - 99 mg/dL   BUN 13  6 - 23 mg/dL   Creatinine, Ser 0.98  0.50 - 1.35 mg/dL   Calcium 9.9  8.4 - 10.5 mg/dL   Total Protein 7.7  6.0 - 8.3 g/dL   Albumin 4.6   3.5 - 5.2 g/dL   AST 40 (*) 0 - 37 U/L   Comment: SLIGHT HEMOLYSIS     HEMOLYSIS AT THIS LEVEL MAY AFFECT RESULT   ALT 53  0 - 53 U/L   Alkaline Phosphatase 80  39 - 117 U/L   Total Bilirubin 0.3  0.3 - 1.2 mg/dL   GFR calc non Af Amer >90  >90 mL/min   GFR calc Af  Amer >90  >90 mL/min   Comment: (NOTE)     The eGFR has been calculated using the CKD EPI equation.     This calculation has not been validated in all clinical situations.     eGFR's persistently <90 mL/min signify possible Chronic Kidney     Disease.  LIPASE, BLOOD     Status: None   Collection Time    05/27/13 12:38 AM      Result Value Ref Range   Lipase 55  11 - 59 U/L  I-STAT TROPOININ, ED     Status: None   Collection Time    05/27/13 12:43 AM      Result Value Ref Range   Troponin i, poc 0.01  0.00 - 0.08 ng/mL   Comment 3            Comment: Due to the release kinetics of cTnI,     a negative result within the first hours     of the onset of symptoms does not rule out     myocardial infarction with certainty.     If myocardial infarction is still suspected,     repeat the test at appropriate intervals.  URINALYSIS, ROUTINE W REFLEX MICROSCOPIC     Status: None   Collection Time    05/27/13  1:03 AM      Result Value Ref Range   Color, Urine YELLOW  YELLOW   APPearance CLEAR  CLEAR   Specific Gravity, Urine 1.021  1.005 - 1.030   pH 5.5  5.0 - 8.0   Glucose, UA NEGATIVE  NEGATIVE mg/dL   Hgb urine dipstick NEGATIVE  NEGATIVE   Bilirubin Urine NEGATIVE  NEGATIVE   Ketones, ur NEGATIVE  NEGATIVE mg/dL   Protein, ur NEGATIVE  NEGATIVE mg/dL   Urobilinogen, UA 0.2  0.0 - 1.0 mg/dL   Nitrite NEGATIVE  NEGATIVE   Leukocytes, UA NEGATIVE  NEGATIVE   Comment: MICROSCOPIC NOT DONE ON URINES WITH NEGATIVE PROTEIN, BLOOD, LEUKOCYTES, NITRITE, OR GLUCOSE <1000 mg/dL.    Imaging / Studies: US Abdomen Limited  05/27/2013   CLINICAL DATA:  Right upper quadrant abdominal pain.  Emesis.  EXAM: US ABDOMEN LIMITED -  RIGHT UPPER QUADRANT  COMPARISON:  No priors.  FINDINGS: Gallbladder:  Multiple echogenic foci with posterior acoustic shadowing wing dependently within the gallbladder, compatible with gallstones, largest of which measures up to 1.5 cm in diameter. Gallbladder wall thickness is normal at 2 to 3 mm. Gallbladder appears only moderately distended. No abnormal pericholecystic fluid. Per report from the sonographer, the patient did not exhibit a sonographic Murphy's sign on examination.  Common bile duct:  Diameter: 4 mm in the porta hepatis.  Liver:  Diffusely increased echogenicity throughout the hepatic parenchyma, suggestive of hepatic steatosis. No focal cystic or solid hepatic lesions. No intrahepatic biliary ductal dilatation. Normal hepatopetal flow in the portal vein.  IMPRESSION: 1. Study is positive for cholelithiasis, but there are no findings to suggest acute cholecystitis at this time. 2. Probable hepatic steatosis.   Electronically Signed   By: Vinnie Langton M.D.   On: 05/27/2013 05:14    Medications / Allergies: per chart  Antibiotics: Anti-infectives   None      Assessment  Shawn Hartman  53 y.o. male       Problem List:  Principal Problem:   Acute cholecystitis with chronic cholecystitis Active Problems:   Steatohepatitis   Inguinal hernia, right   Classic episode of biliary colic with persistent  pain suspicious for acute cholecystitis.  Rest of the differential diagnosis seems unlikely.  Plan:  Admit.  IV antibiotics.  Unison.  Progressive pain in nausea control.  Try to avoid narcotics per patient request given history of prior narcotic addiction.  Laparoscopic cholecystectomy with intraoperative cholangiogram.  I discussed with the patient in detail:  The anatomy & physiology of hepatobiliary & pancreatic function was discussed.  The pathophysiology of gallbladder dysfunction was discussed.  Natural history risks without surgery was discussed.   I feel the  risks of no intervention will lead to serious problems that outweigh the operative risks; therefore, I recommended cholecystectomy to remove the pathology.  I explained laparoscopic techniques with possible need for an open approach.  Probable cholangiogram to evaluate the bilary tract was explained as well.    Risks such as bleeding, infection, abscess, leak, injury to other organs, need for further treatment, heart attack, death, and other risks were discussed.  I noted a good likelihood this will help address the problem.  Possibility that this will not correct all abdominal symptoms was explained.  Goals of post-operative recovery were discussed as well.  We will work to minimize complications.  An educational handout further explaining the pathology and treatment options was given as well.  Questions were answered.  The patient expresses understanding & wishes to proceed with surgery.  VTE prophylaxis- SCDs, etc  mobilize as tolerated to help recovery  Attending colonoscopy since age greater than 57 to rule out polyps.  Focus on tobacco sensation.  STOP SMOKING! We talked to the patient about the dangers of smoking.  We stressed that tobacco use dramatically increases the risk of peri-operative complications such as infection, tissue necrosis leaving to problems with incision/wound and organ healing, hernia, chronic pain, heart attack, stroke, DVT, pulmonary embolism, and death.  We noted there are programs in our community to help stop smoking.  Information was available.  Radial hernia.  He would benefit from repair of this hernia with possible laparoscopic common contralateral side to rule out hernia on that side.  Not symptomatic at this point, social workers here for elective repair.  We are available should he desire elective repair w mesh at a later time.  None good time to repair an inguinal hernia with mesh and same time of a cholecystectomy for acute cholecystitis.  Adin Hector,  M.D., F.A.C.S. Gastrointestinal and Minimally Invasive Surgery [facility] [address] [place],  [address] [phone] Main / Paging   05/27/2013  Note: This dictation was prepared with Dragon/digital dictation along with [facility] technology. Any transcriptional errors that result from this process are unintentional.

## 2013-05-27 NOTE — Discharge Instructions (Signed)
LAPAROSCOPIC SURGERY: POST OP INSTRUCTIONS ° °1. DIET: Follow a light bland diet the first 24 hours after arrival home, such as soup, liquids, crackers, etc.  Be sure to include lots of fluids daily.  Avoid fast food or heavy meals as your are more likely to get nauseated.  Eat a low fat the next few days after surgery.   °2. Take your usually prescribed home medications unless otherwise directed. °3. PAIN CONTROL: °a. Pain is best controlled by a usual combination of three different methods TOGETHER: °i. Ice/Heat °ii. Over the counter pain medication °iii. Prescription pain medication °b. Most patients will experience some swelling and bruising around the incisions.  Ice packs or heating pads (30-60 minutes up to 6 times a day) will help. Use ice for the first few days to help decrease swelling and bruising, then switch to heat to help relax tight/sore spots and speed recovery.  Some people prefer to use ice alone, heat alone, alternating between ice & heat.  Experiment to what works for you.  Swelling and bruising can take several weeks to resolve.   °c. It is helpful to take an over-the-counter pain medication regularly for the first few weeks.  Choose one of the following that works best for you: °i. Naproxen (Aleve, etc)  Two 220mg tabs twice a day °ii. Ibuprofen (Advil, etc) Three 200mg tabs four times a day (every meal & bedtime) °iii. Acetaminophen (Tylenol, etc) 500-650mg four times a day (every meal & bedtime) °d. A  prescription for pain medication (such as oxycodone, hydrocodone, etc) should be given to you upon discharge.  Take your pain medication as prescribed.  °i. If you are having problems/concerns with the prescription medicine (does not control pain, nausea, vomiting, rash, itching, etc), please call us [phone] to see if we need to switch you to a different pain medicine that will work better for you and/or control your side effect better. °ii. If you need a refill on your pain medication,  please contact your pharmacy.  They will contact our office to request authorization. Prescriptions will not be filled after 5 pm or on week-ends. °4. Avoid getting constipated.  Between the surgery and the pain medications, it is common to experience some constipation.  Increasing fluid intake and taking a fiber supplement (such as Metamucil, Citrucel, FiberCon, MiraLax, etc) 1-2 times a day regularly will usually help prevent this problem from occurring.  A mild laxative (prune juice, Milk of Magnesia, MiraLax, etc) should be taken according to package directions if there are no bowel movements after 48 hours.   °5. Watch out for diarrhea.  If you have many loose bowel movements, simplify your diet to bland foods & liquids for a few days.  Stop any stool softeners and decrease your fiber supplement.  Switching to mild anti-diarrheal medications (Kayopectate, Pepto Bismol) can help.  If this worsens or does not improve, please call us. °6. Wash / shower every day.  You may shower over the dressings as they are waterproof.  Continue to shower over incision(s) after the dressing is off. °7. Remove your waterproof bandages 5 days after surgery.  You may leave the incision open to air.  You may replace a dressing/Band-Aid to cover the incision for comfort if you wish.  °8. ACTIVITIES as tolerated:   °a. You may resume regular (light) daily activities beginning the next day--such as daily self-care, walking, climbing stairs--gradually increasing activities as tolerated.  If you can walk 30 minutes without difficulty, it   is safe to try more intense activity such as jogging, treadmill, bicycling, low-impact aerobics, swimming, etc. b. Save the most intensive and strenuous activity for last such as sit-ups, heavy lifting, contact sports, etc  Refrain from any heavy lifting or straining until you are off narcotics for pain control.   c. DO NOT PUSH THROUGH PAIN.  Let pain be your guide: If it hurts to do something, don't  do it.  Pain is your body warning you to avoid that activity for another week until the pain goes down. d. You may drive when you are no longer taking prescription pain medication, you can comfortably wear a seatbelt, and you can safely maneuver your car and apply brakes. e. Bonita Quin may have sexual intercourse when it is comfortable.  9. FOLLOW UP in our office a. Please call [facility] at [phone] to set up an appointment to see your surgeon in the office for a follow-up appointment approximately 2-3 weeks after your surgery. b. Make sure that you call for this appointment the day you arrive home to insure a convenient appointment time. 10. IF YOU HAVE DISABILITY OR FAMILY LEAVE FORMS, BRING THEM TO THE OFFICE FOR PROCESSING.  DO NOT GIVE THEM TO YOUR DOCTOR.   WHEN TO CALL us [phone]: 1. Poor pain control 2. Reactions / problems with new medications (rash/itching, nausea, etc)  3. Fever over 101.5 F (38.5 C) 4. Inability to urinate 5. Nausea and/or vomiting 6. Worsening swelling or bruising 7. Continued bleeding from incision. 8. Increased pain, redness, or drainage from the incision   The clinic staff is available to answer your questions during regular business hours (8:30am-5pm).  Please dont hesitate to call and ask to speak to one of our nurses for clinical concerns.   If you have a medical emergency, go to the nearest emergency room or call 911.  A surgeon from [facility] is always on call at the [facility], [place] [address], [address], [place], [place]  [address] ? MAIN: [phone] ? TOLL FREE: [phone] ?  FAX [phone] [email or web address]  Consider elective surgical repair of your right inguinal groin hernia after he recovered from this emergent/urgent surgery.  Consider colonoscopy secure over 10 years old to rule out colon polyps and colon cancer.  Cholecystitis Cholecystitis is an inflammation of  your gallbladder. It is usually caused by a buildup of gallstones or sludge (cholelithiasis) in your gallbladder. The gallbladder stores a fluid that helps digest fats (bile). Cholecystitis is serious and needs treatment right away.  CAUSES   Gallstones. Gallstones can block the tube that leads to your gallbladder, causing bile to build up. As bile builds up, the gallbladder becomes inflamed.  Bile duct problems, such as blockage from scarring or kinking.  Tumors. Tumors can stop bile from leaving your gallbladder correctly, causing bile to build up. As bile builds up, the gallbladder becomes inflamed. SYMPTOMS   Nausea.  Vomiting.  Abdominal pain, especially in the upper right area of your abdomen.  Abdominal tenderness or bloating.  Sweating.  Chills.  Fever.  Yellowing of the skin and the whites of the eyes (jaundice). DIAGNOSIS  Your caregiver may order blood tests to look for infection or gallbladder problems. Your caregiver may also order imaging tests, such as an ultrasound or computed tomography (CT) scan. Further tests may include a hepatobiliary iminodiacetic acid (HIDA) scan. This scan allows your caregiver to see your bile move from the liver to  the gallbladder and to the small intestine. TREATMENT  A hospital stay is usually necessary to lessen the inflammation of your gallbladder. You may be required to not eat or drink (fast) for a certain amount of time. You may be given medicine to treat pain or an antibiotic medicine to treat an infection. Surgery may be needed to remove your gallbladder (cholecystectomy) once the inflammation has gone down. Surgery may be needed right away if you develop complications such as death of gallbladder tissue (gangrene) or a tear (perforation) of the gallbladder.  HOME CARE INSTRUCTIONS  Home care will depend on your treatment. In general:  If you were given antibiotics, take them as directed. Finish them even if you start to feel  better.  Only take over-the-counter or prescription medicines for pain, discomfort, or fever as directed by your caregiver.  Follow a low-fat diet until you see your caregiver again.  Keep all follow-up visits as directed by your caregiver. SEEK IMMEDIATE MEDICAL CARE IF:   Your pain is increasing and not controlled by medicines.  Your pain moves to another part of your abdomen or to your back.  You have a fever.  You have nausea and vomiting. MAKE SURE YOU:  Understand these instructions.  Will watch your condition.  Will get help right away if you are not doing well or get worse. Document Released: 02/20/2005 Document Revised: 05/15/2011 Document Reviewed: 01/06/2011 [facility] Patient Information 2014 [place], [place].  [place]!  We strongly recommend that you stop smoking.  Smoking increases the risk of surgery including infection in the form of an open wound, pus formation, abscess, hernia at an incision on the abdomen, etc.  You have an increased risk of other MAJOR complications such as stroke, heart attack, forming clots in the leg and/or lungs, and death.    Smoking Cessation Quitting smoking is important to your health and has many advantages. However, it is not always easy to quit since nicotine is a very addictive drug. Often times, people try 3 times or more before being able to quit. This document explains the best ways for you to prepare to quit smoking. Quitting takes hard work and a lot of effort, but you can do it. ADVANTAGES OF QUITTING SMOKING  You will live longer, feel better, and live better.  Your body will feel the impact of quitting smoking almost immediately.  Within 20 minutes, blood pressure decreases. Your pulse returns to its normal level.  After 8 hours, carbon monoxide levels in the blood return to normal. Your oxygen level increases.  After 24 hours, the chance of having a heart attack starts to decrease. Your breath, hair, and body stop  smelling like smoke.  After 48 hours, damaged nerve endings begin to recover. Your sense of taste and smell improve.  After 72 hours, the body is virtually free of nicotine. Your bronchial tubes relax and breathing becomes easier.  After 2 to 12 weeks, lungs can hold more air. Exercise becomes easier and circulation improves.  The risk of having a heart attack, stroke, cancer, or lung disease is greatly reduced.  After 1 year, the risk of coronary heart disease is cut in half.  After 5 years, the risk of stroke falls to the same as a nonsmoker.  After 10 years, the risk of lung cancer is cut in half and the risk of other cancers decreases significantly.  After 15 years, the risk of coronary heart disease drops, usually to the level of a nonsmoker.  If you are pregnant, quitting smoking will improve your chances of having a healthy baby.  The people you live with, especially any children, will be healthier.  You will have extra money to spend on things other than cigarettes. QUESTIONS TO THINK ABOUT BEFORE ATTEMPTING TO QUIT You may want to talk about your answers with your caregiver.  Why do you want to quit?  If you tried to quit in the past, what helped and what did not?  What will be the most difficult situations for you after you quit? How will you plan to handle them?  Who can help you through the tough times? Your family? Friends? A caregiver?  What pleasures do you get from smoking? What ways can you still get pleasure if you quit? Here are some questions to ask your caregiver:  How can you help me to be successful at quitting?  What medicine do you think would be best for me and how should I take it?  What should I do if I need more help?  What is smoking withdrawal like? How can I get information on withdrawal? GET READY  Set a quit date.  Change your environment by getting rid of all cigarettes, ashtrays, matches, and lighters in your home, car, or work. Do  not let people smoke in your home.  Review your past attempts to quit. Think about what worked and what did not. GET SUPPORT AND ENCOURAGEMENT You have a better chance of being successful if you have help. You can get support in many ways.  Tell your family, friends, and co-workers that you are going to quit and need their support. Ask them not to smoke around you.  Get individual, group, or telephone counseling and support. Programs are available at [employer] and health centers. Call your local health department for information about programs in your area.  Spiritual beliefs and practices may help some smokers quit.  Download a "quit meter" on your computer to keep track of quit statistics, such as how long you have gone without smoking, cigarettes not smoked, and money saved.  Get a self-help book about quitting smoking and staying off of tobacco. LEARN NEW SKILLS AND BEHAVIORS  Distract yourself from urges to smoke. Talk to someone, go for a walk, or occupy your time with a task.  Change your normal routine. Take a different route to work. Drink tea instead of coffee. Eat breakfast in a different place.  Reduce your stress. Take a hot bath, exercise, or read a book.  Plan something enjoyable to do every day. Reward yourself for not smoking.  Explore interactive web-based programs that specialize in helping you quit. GET MEDICINE AND USE IT CORRECTLY Medicines can help you stop smoking and decrease the urge to smoke. Combining medicine with the above behavioral methods and support can greatly increase your chances of successfully quitting smoking.  Nicotine replacement therapy helps deliver nicotine to your body without the negative effects and risks of smoking. Nicotine replacement therapy includes nicotine gum, lozenges, inhalers, nasal sprays, and skin patches. Some may be available over-the-counter and others require a prescription.  Antidepressant medicine helps people  abstain from smoking, but how this works is unknown. This medicine is available by prescription.  Nicotinic receptor partial agonist medicine simulates the effect of nicotine in your brain. This medicine is available by prescription. Ask your caregiver for advice about which medicines to use and how to use them based on your health history. Your caregiver will tell you  what side effects to look out for if you choose to be on a medicine or therapy. Carefully read the information on the package. Do not use any other product containing nicotine while using a nicotine replacement product.  RELAPSE OR DIFFICULT SITUATIONS Most relapses occur within the first 3 months after quitting. Do not be discouraged if you start smoking again. Remember, most people try several times before finally quitting. You may have symptoms of withdrawal because your body is used to nicotine. You may crave cigarettes, be irritable, feel very hungry, cough often, get headaches, or have difficulty concentrating. The withdrawal symptoms are only temporary. They are strongest when you first quit, but they will go away within 10 14 days. To reduce the chances of relapse, try to:  Avoid drinking alcohol. Drinking lowers your chances of successfully quitting.  Reduce the amount of caffeine you consume. Once you quit smoking, the amount of caffeine in your body increases and can give you symptoms, such as a rapid heartbeat, sweating, and anxiety.  Avoid smokers because they can make you want to smoke.  Do not let weight gain distract you. Many smokers will gain weight when they quit, usually less than 10 pounds. Eat a healthy diet and stay active. You can always lose the weight gained after you quit.  Find ways to improve your mood other than smoking. FOR MORE INFORMATION  www.smokefree.gov    While it can be one of the most difficult things to do, the [place] has programs to help you stop.  Consider talking with your  primary care physician about options.  Also, Smoking Cessation classes are available through the [facility]:  The smoking cessation program is a proven-effective program from the American Lung Association. The program is available for anyone 22 and older who currently smokes. The program lasts for 7 weeks and is 8 sessions. Each class will be approximately 1 1/2 hours. The program is every Tuesday.  All classes are 12-1:30pm and same location.  Event Location Information:  Location: [facility] 2nd Floor Conference Room 2-037; located next to [facility] cross streets: [facility] Entrance into the [facility] is adjacent to the [facility] main entrance. The conference room is located on the 2nd floor.  Parking Instructions: Visitor parking is adjacent to [employer] main entrance and the Cancer Center    A smoking cessation program is also offered through the [facility]. Register online at [email or web address] or call [phone] for more information.   Tobacco cessation counseling is available at [facility]. Call [phone] for a free appointment.   Tobacco cessation classes also are available through the [facility] in [place]. For information, call [phone].   The Patient Education Network features videos on tobacco cessation. Please consult your listings in the center of this book to find instructions on how to access this resource.   If you want more information, ask your nurse.

## 2013-05-27 NOTE — Progress Notes (Signed)
Pt arrived to unit on stretcher. Amb to chair w/ steady, indep gait. No c/o pain/nausea at present. VSS. Oriented to callbell and environment. POC discussed. Pt aware NPO until surgery and agreeable. Consent signed. Sitting up in chair, watching TV w/ no c/o.

## 2013-05-27 NOTE — ED Provider Notes (Signed)
CSN: [id number]     Arrival date & time 05/26/13  2339 History   First MD Initiated Contact with Patient 05/27/13 0239     Chief Complaint  Patient presents with  . Emesis  . Abdominal Pain     (Consider location/radiation/quality/duration/timing/severity/associated sxs/prior Treatment) HPI Patient is a 53 yo man with no previous abdominal surgeries and no significant PMH. He presents with RUQ abdominal pain, nausea and vomiting. Sx began shortly after the patient ate dinner at 1700. He estimates that he has vomited 10 times. He has not had diarrhea. Last BM was about 20 hrs ago and was wnl. No known fevers. Pain radiates to the back.   Pain was severe at worst. It is aching and cramping. Currently it is moderate. No GU sx. Nothing seems to makes sx worse or better. History of similar sx a couple of months ago. Sx were less severe that time and resolved without intervention - patient did not seek medical care.   No past medical history on file. Past Surgical History  Procedure Laterality Date  . Wrist arthroplasty    . Cervical spine surgery      C3-5 FUSED. bROKEN NECK  . Foot surgery Right   . Knee arthroscopy Bilateral     MULTIPLE   No family history on file. History  Substance Use Topics  . Smoking status: Current Every Day Smoker -- 0.25 packs/day  . Smokeless tobacco: Not on file  . Alcohol Use: No    Review of Systems   Ten point review of symptoms performed and is negative with the exception of symptoms noted above.   Allergies  Review of patient's allergies indicates no known allergies.  Home Medications  No current outpatient prescriptions on file. BP 142/75  Pulse 50  Temp(Src) 97.5 F (36.4 C) (Oral)  Resp 16  SpO2 98% Physical Exam Gen: well developed and well nourished appearing Head: NCAT Eyes: PERL, EOMI Nose: no epistaixis or rhinorrhea Mouth/throat: mucosa is moist and pink Neck: supple, no stridor Lungs: CTA B, no wheezing, rhonchi or  rales CV: RRR, no murmur, extremities appear well perfused.  Abd: soft, ttp over the RUQ and the midline epigastrium nondistended Back: no ttp, no cva ttp Skin: warm and dry Ext: normal to inspection, no dependent edema Neuro: CN ii-xii grossly intact, no focal deficits Psyche; normal affect,  calm and cooperative.   ED Course  Procedures (including critical care time) Labs Review  Results for orders placed during the hospital encounter of 05/27/13 (from the past 24 hour(s))  CBC WITH DIFFERENTIAL     Status: Abnormal   Collection Time    05/27/13 12:38 AM      Result Value Ref Range   WBC 18.3 (*) 4.0 - 10.5 K/uL   RBC 5.75  4.22 - 5.81 MIL/uL   Hemoglobin 17.8 (*) 13.0 - 17.0 g/dL   HCT 78.2  95.6 - 21.3 %   MCV 85.7  78.0 - 100.0 fL   MCH 31.0  26.0 - 34.0 pg   MCHC 36.1 (*) 30.0 - 36.0 g/dL   RDW 08.6  57.8 - 46.9 %   Platelets 297  150 - 400 K/uL   Neutrophils Relative % 87 (*) 43 - 77 %   Neutro Abs 15.8 (*) 1.7 - 7.7 K/uL   Lymphocytes Relative 9 (*) 12 - 46 %   Lymphs Abs 1.7  0.7 - 4.0 K/uL   Monocytes Relative 3  3 - 12 %   Monocytes  Absolute 0.6  0.1 - 1.0 K/uL   Eosinophils Relative 1  0 - 5 %   Eosinophils Absolute 0.1  0.0 - 0.7 K/uL   Basophils Relative 0  0 - 1 %   Basophils Absolute 0.1  0.0 - 0.1 K/uL  COMPREHENSIVE METABOLIC PANEL     Status: Abnormal   Collection Time    05/27/13 12:38 AM      Result Value Ref Range   Sodium 142  137 - 147 mEq/L   Potassium 4.2  3.7 - 5.3 mEq/L   Chloride 102  96 - 112 mEq/L   CO2 22  19 - 32 mEq/L   Glucose, Bld 138 (*) 70 - 99 mg/dL   BUN 13  6 - 23 mg/dL   Creatinine, Ser 0.980.98  0.50 - 1.35 mg/dL   Calcium 9.9  8.4 - 11.910.5 mg/dL   Total Protein 7.7  6.0 - 8.3 g/dL   Albumin 4.6  3.5 - 5.2 g/dL   AST 40 (*) 0 - 37 U/L   ALT 53  0 - 53 U/L   Alkaline Phosphatase 80  39 - 117 U/L   Total Bilirubin 0.3  0.3 - 1.2 mg/dL   GFR calc non Af Amer >90  >90 mL/min   GFR calc Af Amer >90  >90 mL/min  LIPASE, BLOOD      Status: None   Collection Time    05/27/13 12:38 AM      Result Value Ref Range   Lipase 55  11 - 59 U/L  I-STAT TROPOININ, ED     Status: None   Collection Time    05/27/13 12:43 AM      Result Value Ref Range   Troponin i, poc 0.01  0.00 - 0.08 ng/mL   Comment 3           URINALYSIS, ROUTINE W REFLEX MICROSCOPIC     Status: None   Collection Time    05/27/13  1:03 AM      Result Value Ref Range   Color, Urine YELLOW  YELLOW   APPearance CLEAR  CLEAR   Specific Gravity, Urine 1.021  1.005 - 1.030   pH 5.5  5.0 - 8.0   Glucose, UA NEGATIVE  NEGATIVE mg/dL   Hgb urine dipstick NEGATIVE  NEGATIVE   Bilirubin Urine NEGATIVE  NEGATIVE   Ketones, ur NEGATIVE  NEGATIVE mg/dL   Protein, ur NEGATIVE  NEGATIVE mg/dL   Urobilinogen, UA 0.2  0.0 - 1.0 mg/dL   Nitrite NEGATIVE  NEGATIVE   Leukocytes, UA NEGATIVE  NEGATIVE    US Abdomen Limited (Final result)  Result time: 05/27/13 05:14:55    Final result by Rad Results In Interface (05/27/13 05:14:55)    Narrative:   CLINICAL DATA: Right upper quadrant abdominal pain. Emesis.  EXAM: US ABDOMEN LIMITED - RIGHT UPPER QUADRANT  COMPARISON: No priors.  FINDINGS: Gallbladder:  Multiple echogenic foci with posterior acoustic shadowing wing dependently within the gallbladder, compatible with gallstones, largest of which measures up to 1.5 cm in diameter. Gallbladder wall thickness is normal at 2 to 3 mm. Gallbladder appears only moderately distended. No abnormal pericholecystic fluid. Per report from the sonographer, the patient did not exhibit a sonographic Murphy's sign on examination.  Common bile duct:  Diameter: 4 mm in the porta hepatis.  Liver:  Diffusely increased echogenicity throughout the hepatic parenchyma, suggestive of hepatic steatosis. No focal cystic or solid hepatic lesions. No intrahepatic biliary ductal dilatation. Normal hepatopetal  flow in the portal vein.  IMPRESSION: 1. Study is positive for  cholelithiasis, but there are no findings to suggest acute cholecystitis at this time. 2. Probable hepatic steatosis.   Electronically Signed By: Trudie Reed M.D. On: 05/27/2013 05:14    MDM   Patient with persistent epigastric pain with TTP over the GB and significant leukocytosis. Case discussed with Dr. Michaell Cowing who will see and consult on this patient.     Brandt Loosen, MD 05/27/13 [phone]

## 2013-05-27 NOTE — Anesthesia Postprocedure Evaluation (Signed)
  Anesthesia Post-op Note  Patient: Manfred ShirtsWilliam Primo  Procedure(s) Performed: Procedure(s) (LRB): LAPAROSCOPIC CHOLECYSTECTOMY WITH INTRAOPERATIVE CHOLANGIOGRAM (N/A)  Patient Location: PACU  Anesthesia Type: General  Level of Consciousness: awake and alert   Airway and Oxygen Therapy: Patient Spontanous Breathing  Post-op Pain: mild  Post-op Assessment: Post-op Vital signs reviewed, Patient's Cardiovascular Status Stable, Respiratory Function Stable, Patent Airway and No signs of Nausea or vomiting  Last Vitals:  Filed Vitals:   05/27/13 1837  BP: 160/100  Pulse: 65  Temp: 36.6 C  Resp: 16    Post-op Vital Signs: stable   Complications: No apparent anesthesia complications

## 2013-05-27 NOTE — Preoperative (Signed)
Beta Blockers   Reason not to administer Beta Blockers:Not Applicable 

## 2013-05-27 NOTE — Progress Notes (Signed)
Pt has voided, is tolerating clear liquids, and has ambulated with steady gait around unit. Pt continues to deny nausea or pain requiring medication. EAger for d/c home. D/C instructions reviewed w/ pt. He verbalizes understanding and all questions answered. Pt expecting sister at [id number] for ride home. Report given to oncoming RN that pt's IV needs to be d/c prior to d/c.

## 2013-05-27 NOTE — Op Note (Addendum)
05/27/2013  4:42 PM  PATIENT:  Shawn Hartman  53 y.o. male  PRE-OPERATIVE DIAGNOSIS:  cholecystitis  POST-OPERATIVE DIAGNOSIS:  cholelithiasis and cholecystitis, umbilical hernia  PROCEDURE:  Procedure(s) with comments: LAPAROSCOPIC CHOLECYSTECTOMY WITH INTRAOPERATIVE CHOLANGIOGRAM (N/A) - PRIMARY REPAIR OF UMBILICAL HERNIA  SURGEON:  Surgeon(s) and Role:    * Robyne AskewPaul S Toth III, MD - Primary  PHYSICIAN ASSISTANT:   ASSISTANTS: none   ANESTHESIA:   general  EBL:  Total I/O In: 745 [I.V.:645; IV Piggyback:100] Out: 0   BLOOD ADMINISTERED:none  DRAINS: none   LOCAL MEDICATIONS USED:  MARCAINE     SPECIMEN:  Source of Specimen:  gallbladder  DISPOSITION OF SPECIMEN:  PATHOLOGY  COUNTS:  YES  TOURNIQUET:  * No tourniquets in log *  DICTATION: .Dragon Dictation  Procedure: After informed consent was obtained the patient was brought to the operating room and placed in the supine position on the operating room table. After adequate induction of general anesthesia the patient's abdomen was prepped with ChloraPrep allowed to dry and draped in usual sterile manner. The area below the umbilicus was infiltrated with quarter percent  Marcaine. A small incision was made with a 15 blade knife. The incision was carried down through the subcutaneous tissue bluntly with a hemostat and [employer] retractors. The linea alba was identified. There was a small umbilical hernia identified. The linea alba was incised through the hernia with a 15 blade knife and each side was grasped with Coker clamps. The preperitoneal space was then probed with a hemostat until the peritoneum was opened and access was gained to the abdominal cavity. A 0 Vicryl pursestring stitch was placed in the fascia surrounding the opening. A Hassan cannula was then placed through the opening and anchored in place with the previously placed Vicryl purse string stitch. The abdomen was insufflated with carbon dioxide without  difficulty. A laparoscope was [facility] in the right upper quadrant was inspected. Next the epigastric region was infiltrated with % Marcaine. A small incision was made with a 15 blade knife. A 10 mm port was placed bluntly through this incision into the abdominal cavity under direct vision. Next 2 sites were chosen laterally on the right side of the abdomen for placement of 5 mm ports. Each of these areas was infiltrated with quarter percent Marcaine. Small stab incisions were made with a 15 blade knife. 5 mm ports were then placed bluntly through these incisions into the abdominal cavity under direct vision without difficulty. A blunt grasper was placed through the lateralmost 5 mm port and used to grasp the dome of the gallbladder and elevated anteriorly and superiorly. Another blunt grasper was placed through the other 5 mm port and used to retract the body and neck of the gallbladder. A dissector was placed through the epigastric port and using the electrocautery the peritoneal reflection at the gallbladder neck was opened. Blunt dissection was then carried out in this area until the gallbladder neck-cystic duct junction was readily identified and a good window was created. A single clip was placed on the gallbladder neck. A small  ductotomy was made just below the clip with laparoscopic scissors. A 14-gauge Angiocath was then placed through the anterior abdominal wall under direct vision. A Reddick cholangiogram catheter was then placed through the Angiocath and flushed. The catheter was then placed in the cystic duct and anchored in place with a clip. A cholangiogram was obtained that showed no filling defects good emptying into the duodenum an  adequate length on the cystic duct. The anchoring clip and catheters were then removed from the patient. 3 clips were placed proximally on the cystic duct and the duct was divided between the 2 sets of clips. Posterior to this the cystic  artery was identified and again dissected bluntly in a circumferential manner until a good window  was created. 2 clips were placed proximally and one distally on the artery and the artery was divided between the 2 sets of clips. Next a laparoscopic hook cautery device was used to separate the gallbladder from the liver bed. Prior to completely detaching the gallbladder from the liver bed the liver bed was inspected and several small bleeding points were coagulated with the electrocautery until the area was completely hemostatic. The gallbladder was then detached the rest of it from the liver bed without difficulty. A laparoscopic bag was inserted through the epigastric port. The gallbladder was placed within the bag and the bag was sealed. A laparoscope was then moved to the epigastric port. The gallbladder grasper was placed through the [facility] cannula and used to grasp the opening of the bag. The bag with the gallbladder was then removed with the [facility] cannula through the infraumbilical port without difficulty. The fascial defect was then closed with the previously placed Vicryl pursestring stitch as well as with another figure-of-eight 0 Vicryl stitch. The liver bed was inspected again and found to be hemostatic. The abdomen was irrigated with copious amounts of saline until the effluent was clear. The ports were then removed under direct vision without difficulty and were found to be hemostatic. The gas was allowed to escape. The skin incisions were all closed with interrupted 4-0 Monocryl subcuticular stitches. Dermabond dressings were applied. The patient tolerated the procedure well. At the end of the case all needle sponge and instrument counts were correct. The patient was then awakened and taken to recovery in stable condition  PLAN OF CARE: Discharge to home after PACU  PATIENT DISPOSITION:  PACU - hemodynamically stable.   Delay start of Pharmacological VTE agent (>24hrs) due to surgical blood  loss or risk of bleeding: not applicable

## 2013-05-28 ENCOUNTER — Encounter (HOSPITAL_COMMUNITY): Payer: Self-pay | Admitting: General Surgery

## 2013-05-30 ENCOUNTER — Telehealth (INDEPENDENT_AMBULATORY_CARE_PROVIDER_SITE_OTHER): Payer: Self-pay

## 2013-05-30 NOTE — Telephone Encounter (Signed)
LMOM> will be glad to provide a RTW  Note and faxed to below number. Just need to know when pt is going back to work. He is only 3 days s/p lap chole. Will be under lifting restrictions if going back before 4/7. Not sure what type of work pt does and if that will effect him going back to work.

## 2013-05-30 NOTE — Telephone Encounter (Signed)
Message copied by Brennan BaileyBROOKS, Leona Alen on Fri May 30, 2013  1:43 PM ------      Message from: Parks NeptuneFOX, DORIS      Created: Fri May 30, 2013  9:29 AM      Regarding: Back To Work Note      Contact: [phone]       Pt is requesting a note to go back to work.  Pt's job fax #: [phone]...df ------

## 2013-05-30 NOTE — Discharge Summary (Signed)
Physician Discharge Summary  Patient ID: Shawn ShirtsWilliam Hartman MRN: [id number] DOB/AGE: 53/05/1960 53 y.o.  Admit date: 05/27/2013 Discharge date: 05/30/2013  Admission Diagnoses:   Cholelithiasis and cholecystitis, umbilical hernia Right inguinal hernia  Discharge Diagnoses:  Same  Principal Problem:   Acute cholecystitis with chronic cholecystitis Active Problems:   Steatohepatitis   Inguinal hernia, right   PROCEDURES: LAPAROSCOPIC CHOLECYSTECTOMY WITH INTRAOPERATIVE CHOLANGIOGRAM (N/A) - PRIMARY REPAIR OF UMBILICAL HERNIA, 05/27/13 Dr. 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 Smokes half a pack a day. No history of bowel problems.  Had an episode of severe upper abdominal pain and nausea vomiting last month. Happened at night. Resolved rather quickly. Had another episode last night. Far more intense. No relief with ginger, Thoms, Pepto-Bismol. Cannot emergency room. Soreness and upper abdomen. Initially started at back. Primarily epigastric. No hematemesis. No sick contacts. No travel history.  He normally has 2-3 bowel movements a day. No change in bowel habits. No personal nor family history of GI/colon cancer, inflammatory bowel disease, irritable bowel syndrome, allergy such as Celiac Sprue, dietary/dairy problems, colitis, ulcers nor gastritis. No recent sick contacts/gastroenteritis. No travel outside the country. No changes in diet. No dysphagia to solids or liquids. No significant heartburn or reflux. No hematochezia, hematemesis, coffee ground emesis. No evidence of prior gastric/peptic ulceration.  He was admitted by Dr. Michaell CowingGross and taken to the OR on 05/27/13.  He went home that evening after the surgery.  Condition on d/c:  Improved   Disposition: 01-Home or Self Care  Discharge Orders   Future Appointments Provider Department Dept Phone   06/16/2013 4:50 PM Robyne AskewPaul S Toth III, MD [facility] [place]  [phone]   Future Orders Complete By Expires   Call MD for:  difficulty breathing, headache or visual disturbances  As directed    Call MD for:  extreme fatigue  As directed    Call MD for:  hives  As directed    Call MD for:  persistant dizziness or light-headedness  As directed    Call MD for:  persistant nausea and vomiting  As directed    Call MD for:  redness, tenderness, or signs of infection (pain, swelling, redness, odor or green/yellow discharge around incision site)  As directed    Call MD for:  severe uncontrolled pain  As directed    Call MD for:  temperature >100.4  As directed    Diet - low sodium heart healthy  As directed    Discharge instructions  As directed    Comments:     May shower. No heavy lifting. Low fat diet   Increase activity slowly  As directed    No wound care  As directed        Medication List         traMADol 50 MG tablet  Commonly known as:  ULTRAM  Take 1-2 tablets (50-100 mg total) by mouth every 6 (six) hours as needed.           Follow-up Information   Follow up with TOTH III,PAUL S, MD In 3 weeks.   Specialty:  General Surgery   Contact information:   [address] [place] [address] [phone]       Signed: Sherrie GeorgeJENNINGS,Amberlin Utke 05/30/2013, 6:18 PM

## 2013-06-16 ENCOUNTER — Ambulatory Visit (INDEPENDENT_AMBULATORY_CARE_PROVIDER_SITE_OTHER): Payer: No Typology Code available for payment source | Admitting: General Surgery

## 2013-06-16 ENCOUNTER — Encounter (INDEPENDENT_AMBULATORY_CARE_PROVIDER_SITE_OTHER): Payer: Self-pay | Admitting: General Surgery

## 2013-06-16 VITALS — BP 134/76 | HR 75 | Temp 97.0°F | Resp 16 | Ht 72.0 in | Wt 234.6 lb

## 2013-06-16 DIAGNOSIS — K812 Acute cholecystitis with chronic cholecystitis: Secondary | ICD-10-CM

## 2013-06-16 NOTE — Patient Instructions (Signed)
May return to normal activities 

## 2013-06-23 ENCOUNTER — Encounter (INDEPENDENT_AMBULATORY_CARE_PROVIDER_SITE_OTHER): Payer: Self-pay | Admitting: General Surgery

## 2013-06-23 NOTE — Progress Notes (Signed)
Subjective:     Patient ID: Shawn Hartman, male   DOB: 12/18/1960, 53 y.o.   MRN: [id number]  HPI The patient is a 53 year old white male who is about 3 weeks status post laparoscopic cholecystectomy for cholecystitis with cholelithiasis. He has done well since the surgery. His pain is improving. His appetite is good and his bowels are working normally.  Review of Systems     Objective:   Physical Exam On exam his abdomen is soft and nontender. His incisions are all healing nicely with no sign of infection.    Assessment:     The patient is 3 weeks status post laparoscopic cholecystectomy     Plan:     At this point I would like him to refrain from any heavy lifting for 3 more weeks. After this he may return to his normal activities. I will plan to see him back on a when necessary basis

## 2014-12-07 ENCOUNTER — Other Ambulatory Visit: Payer: Self-pay | Admitting: *Deleted

## 2014-12-07 ENCOUNTER — Ambulatory Visit
Admission: RE | Admit: 2014-12-07 | Discharge: 2014-12-07 | Disposition: A | Discharge status: Home / Self Care | Payer: No Typology Code available for payment source | Source: Ambulatory Visit | Attending: *Deleted | Admitting: *Deleted

## 2014-12-07 DIAGNOSIS — Z139 Encounter for screening, unspecified: Secondary | ICD-10-CM

## 2015-12-07 ENCOUNTER — Other Ambulatory Visit: Payer: Self-pay | Admitting: *Deleted

## 2015-12-07 ENCOUNTER — Ambulatory Visit
Admission: RE | Admit: 2015-12-07 | Discharge: 2015-12-07 | Disposition: A | Discharge status: Home / Self Care | Payer: No Typology Code available for payment source | Source: Ambulatory Visit | Attending: *Deleted | Admitting: *Deleted

## 2015-12-07 ENCOUNTER — Other Ambulatory Visit: Payer: No Typology Code available for payment source

## 2015-12-07 DIAGNOSIS — Z9289 Personal history of other medical treatment: Secondary | ICD-10-CM

## 2016-12-06 ENCOUNTER — Ambulatory Visit
Admission: RE | Admit: 2016-12-06 | Discharge: 2016-12-06 | Disposition: A | Discharge status: Home / Self Care | Payer: No Typology Code available for payment source | Source: Ambulatory Visit | Attending: *Deleted | Admitting: *Deleted

## 2016-12-06 ENCOUNTER — Other Ambulatory Visit: Payer: Self-pay | Admitting: *Deleted

## 2016-12-06 DIAGNOSIS — Z111 Encounter for screening for respiratory tuberculosis: Secondary | ICD-10-CM

## 2017-12-06 ENCOUNTER — Ambulatory Visit
Admission: RE | Admit: 2017-12-06 | Discharge: 2017-12-06 | Disposition: A | Discharge status: Home / Self Care | Payer: Self-pay | Source: Ambulatory Visit | Attending: *Deleted | Admitting: *Deleted

## 2017-12-06 ENCOUNTER — Other Ambulatory Visit: Payer: Self-pay | Admitting: *Deleted

## 2017-12-06 DIAGNOSIS — R7611 Nonspecific reaction to tuberculin skin test without active tuberculosis: Secondary | ICD-10-CM

## 2017-12-07 ENCOUNTER — Ambulatory Visit
Admission: RE | Admit: 2017-12-07 | Discharge: 2017-12-07 | Disposition: A | Discharge status: Home / Self Care | Payer: No Typology Code available for payment source | Source: Ambulatory Visit | Attending: *Deleted | Admitting: *Deleted

## 2018-12-11 ENCOUNTER — Other Ambulatory Visit: Payer: Self-pay | Admitting: *Deleted

## 2018-12-11 ENCOUNTER — Other Ambulatory Visit: Payer: Self-pay

## 2018-12-11 ENCOUNTER — Ambulatory Visit
Admission: RE | Admit: 2018-12-11 | Discharge: 2018-12-11 | Disposition: A | Discharge status: Home / Self Care | Payer: No Typology Code available for payment source | Source: Ambulatory Visit | Attending: *Deleted | Admitting: *Deleted

## 2018-12-11 DIAGNOSIS — Z111 Encounter for screening for respiratory tuberculosis: Secondary | ICD-10-CM

## 2018-12-13 ENCOUNTER — Other Ambulatory Visit: Payer: Self-pay

## 2018-12-13 DIAGNOSIS — Z20822 Contact with and (suspected) exposure to covid-19: Secondary | ICD-10-CM

## 2018-12-14 LAB — NOVEL CORONAVIRUS, NAA: SARS-CoV-2, NAA: NOT DETECTED

## 2018-12-24 ENCOUNTER — Other Ambulatory Visit: Payer: Self-pay

## 2018-12-24 DIAGNOSIS — Z20822 Contact with and (suspected) exposure to covid-19: Secondary | ICD-10-CM

## 2018-12-25 LAB — NOVEL CORONAVIRUS, NAA: SARS-CoV-2, NAA: NOT DETECTED

## 2019-03-30 IMAGING — CR DG CHEST 2V
2 series · 2 of 2 positions shown · non-contrast
Comparison: Chest radiograph 12/07/2015

CLINICAL DATA: Positive PPD.

EXAM:
CHEST  2 VIEW

[w chest pa]
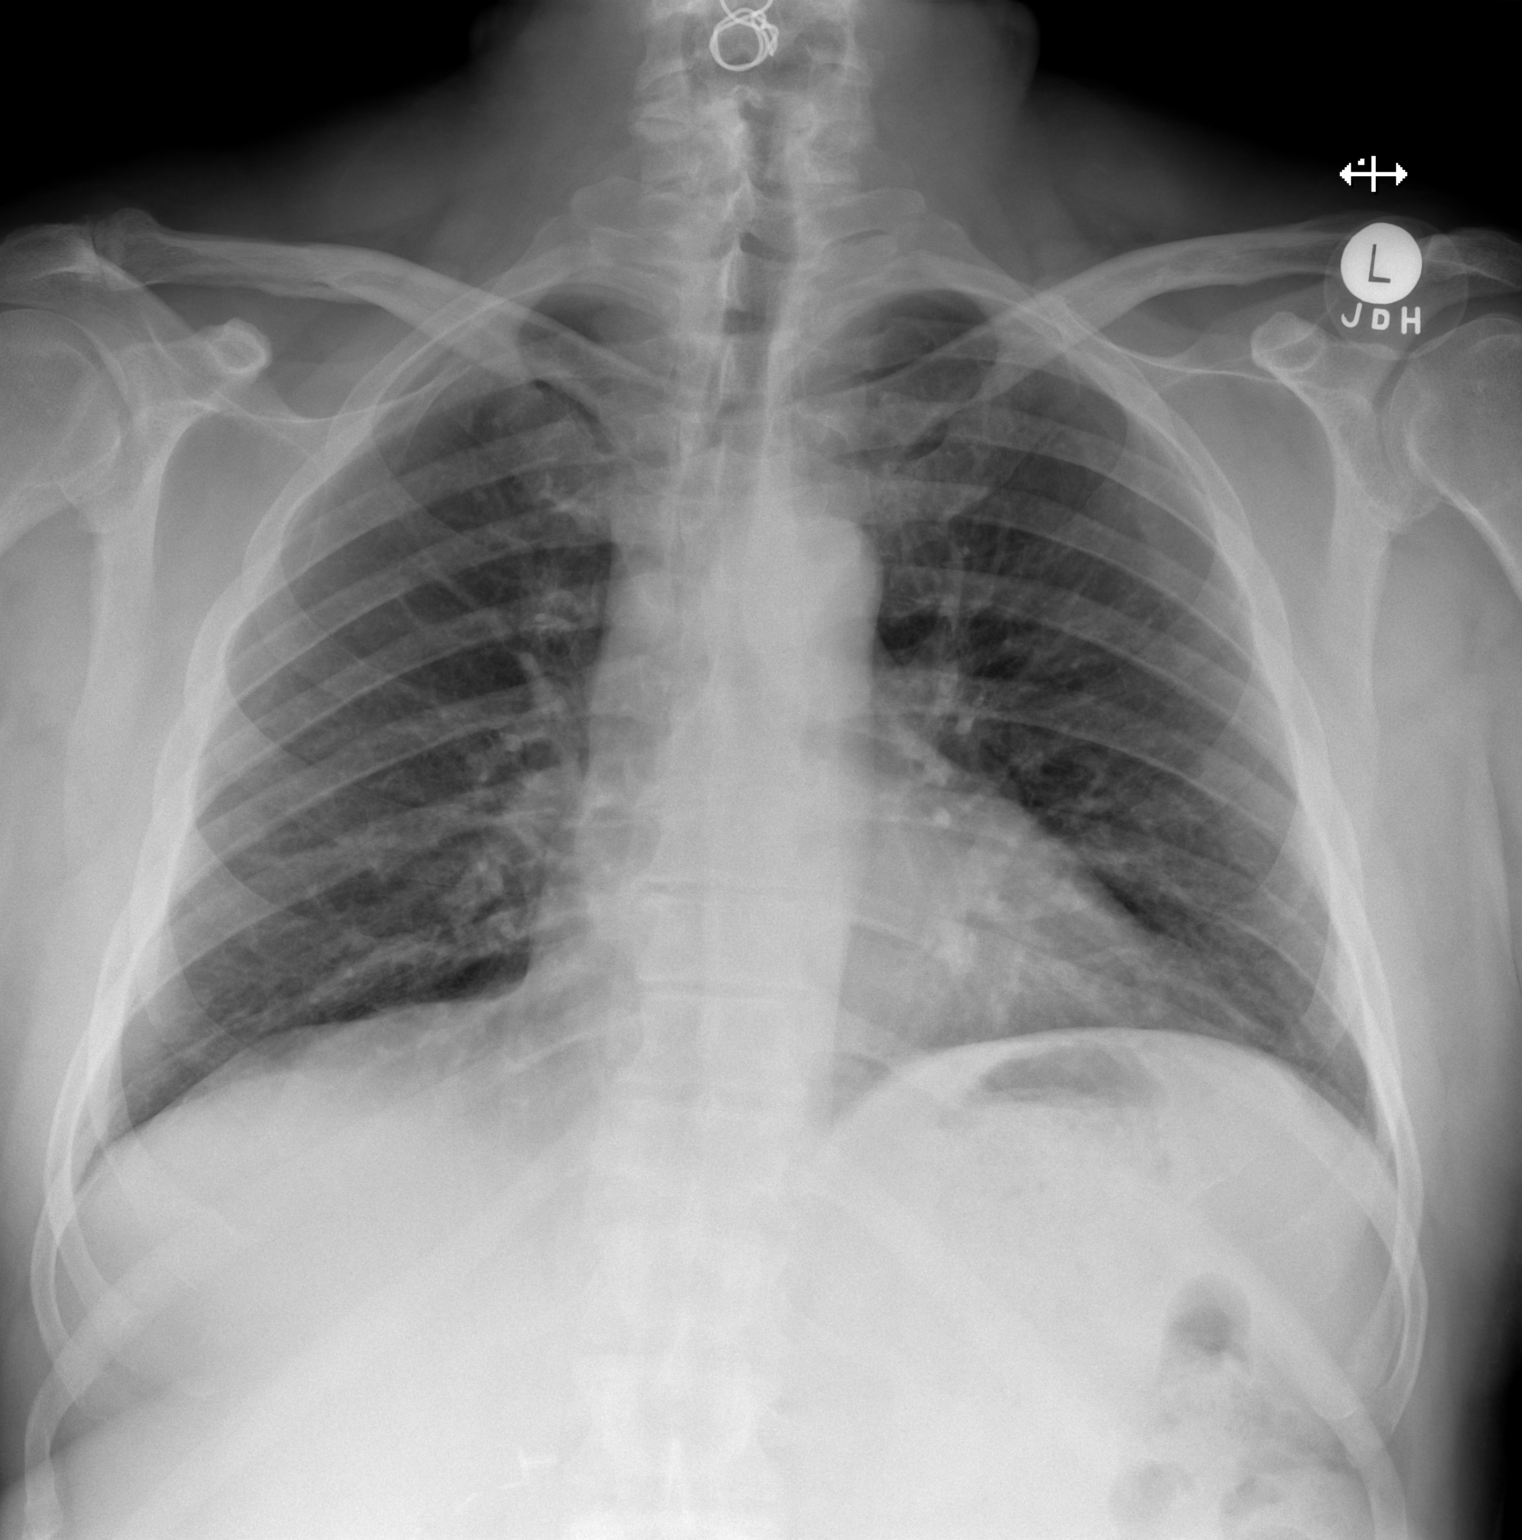

[w chest lat]
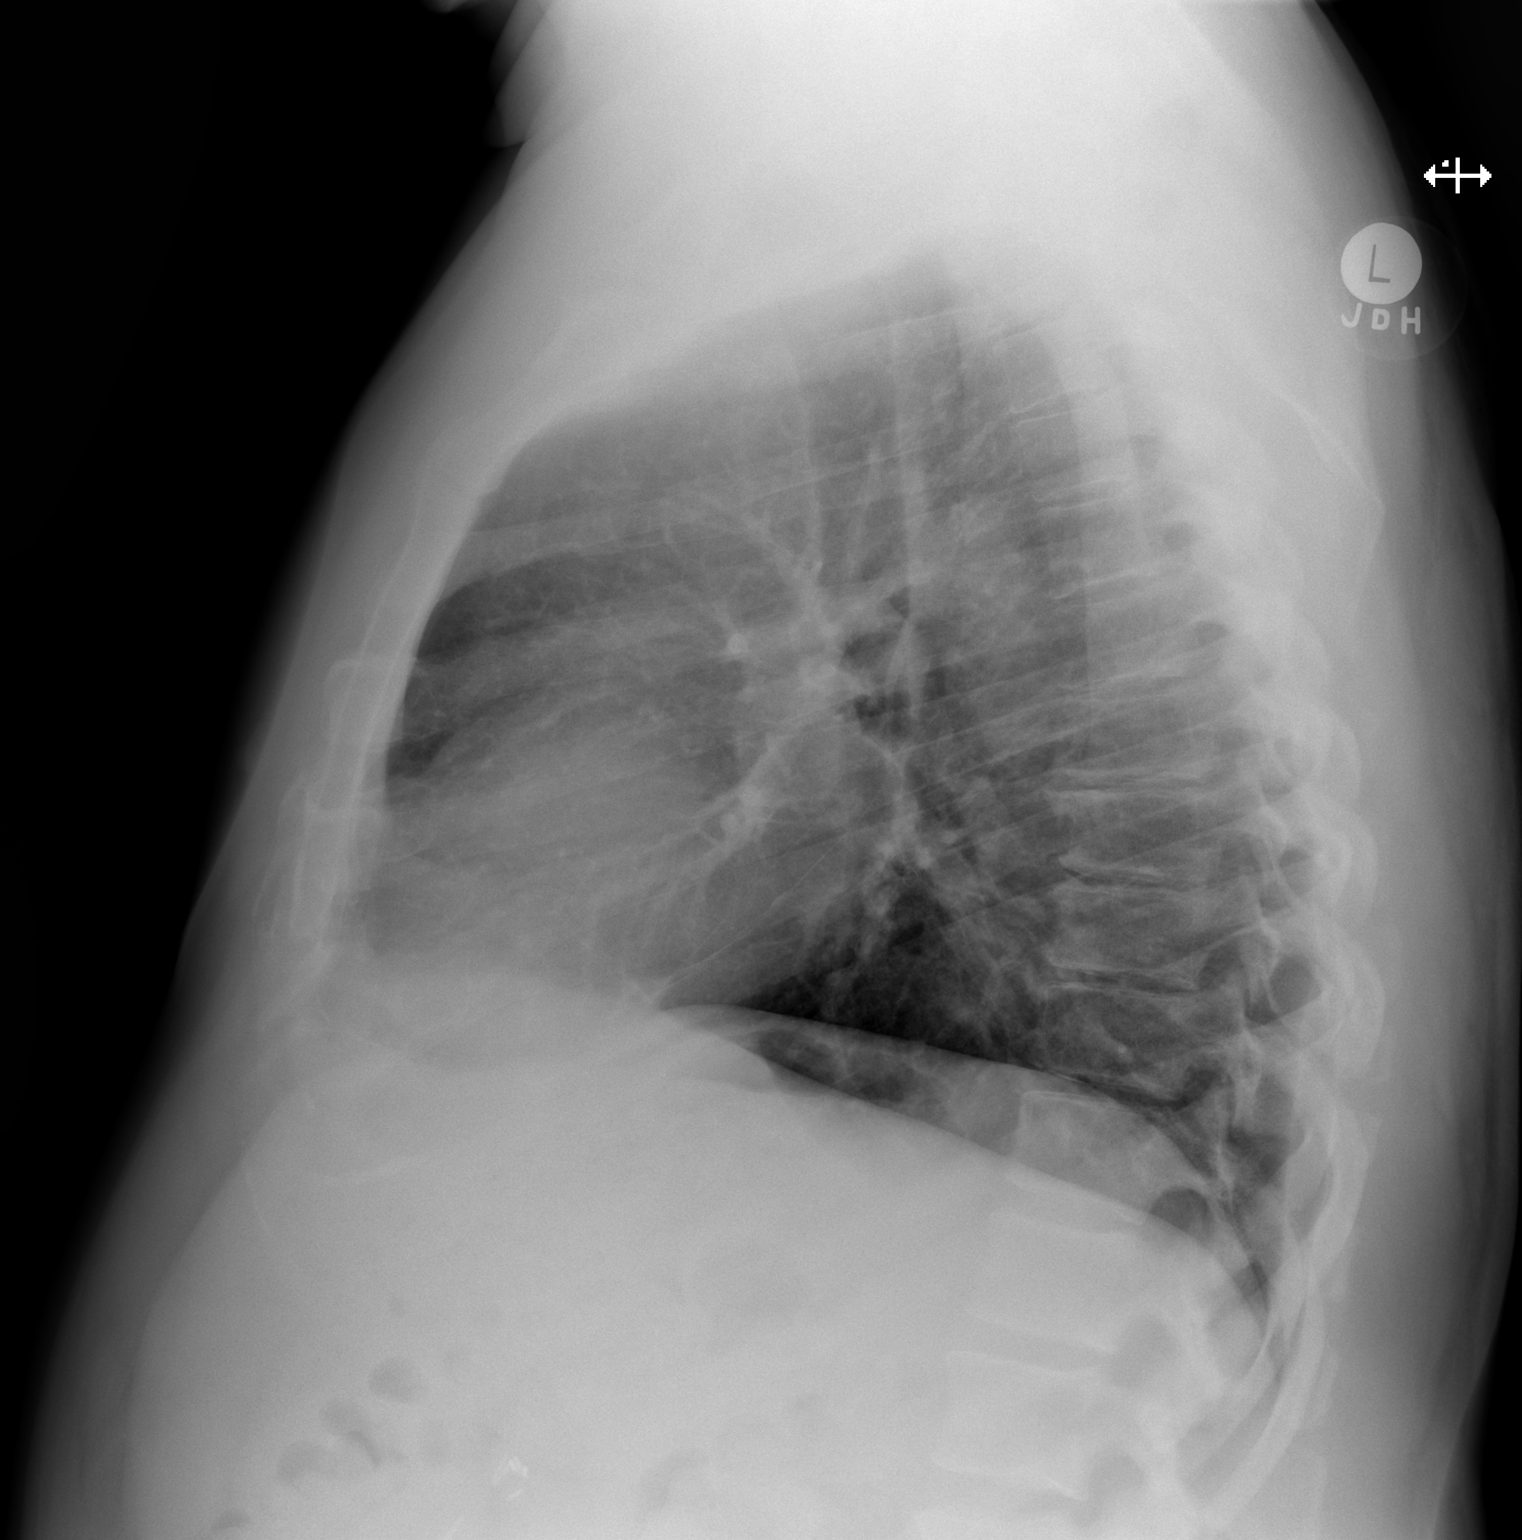

[2 of 2 positions shown; findings below may reference images not displayed]

FINDINGS: The heart size and mediastinal contours are within normal limits.
Both lungs are clear. The visualized skeletal structures are
unremarkable.
IMPRESSION: No active cardiopulmonary disease.

## 2020-03-30 IMAGING — CR DG CHEST 2V
2 series · 2 of 2 positions shown · non-contrast
Comparison: 12/06/2016 chest radiograph

CLINICAL DATA: 56 y/o  M; positive PPD.  No chest complaints.

EXAM:
CHEST - 2 VIEW

[w chest pa]
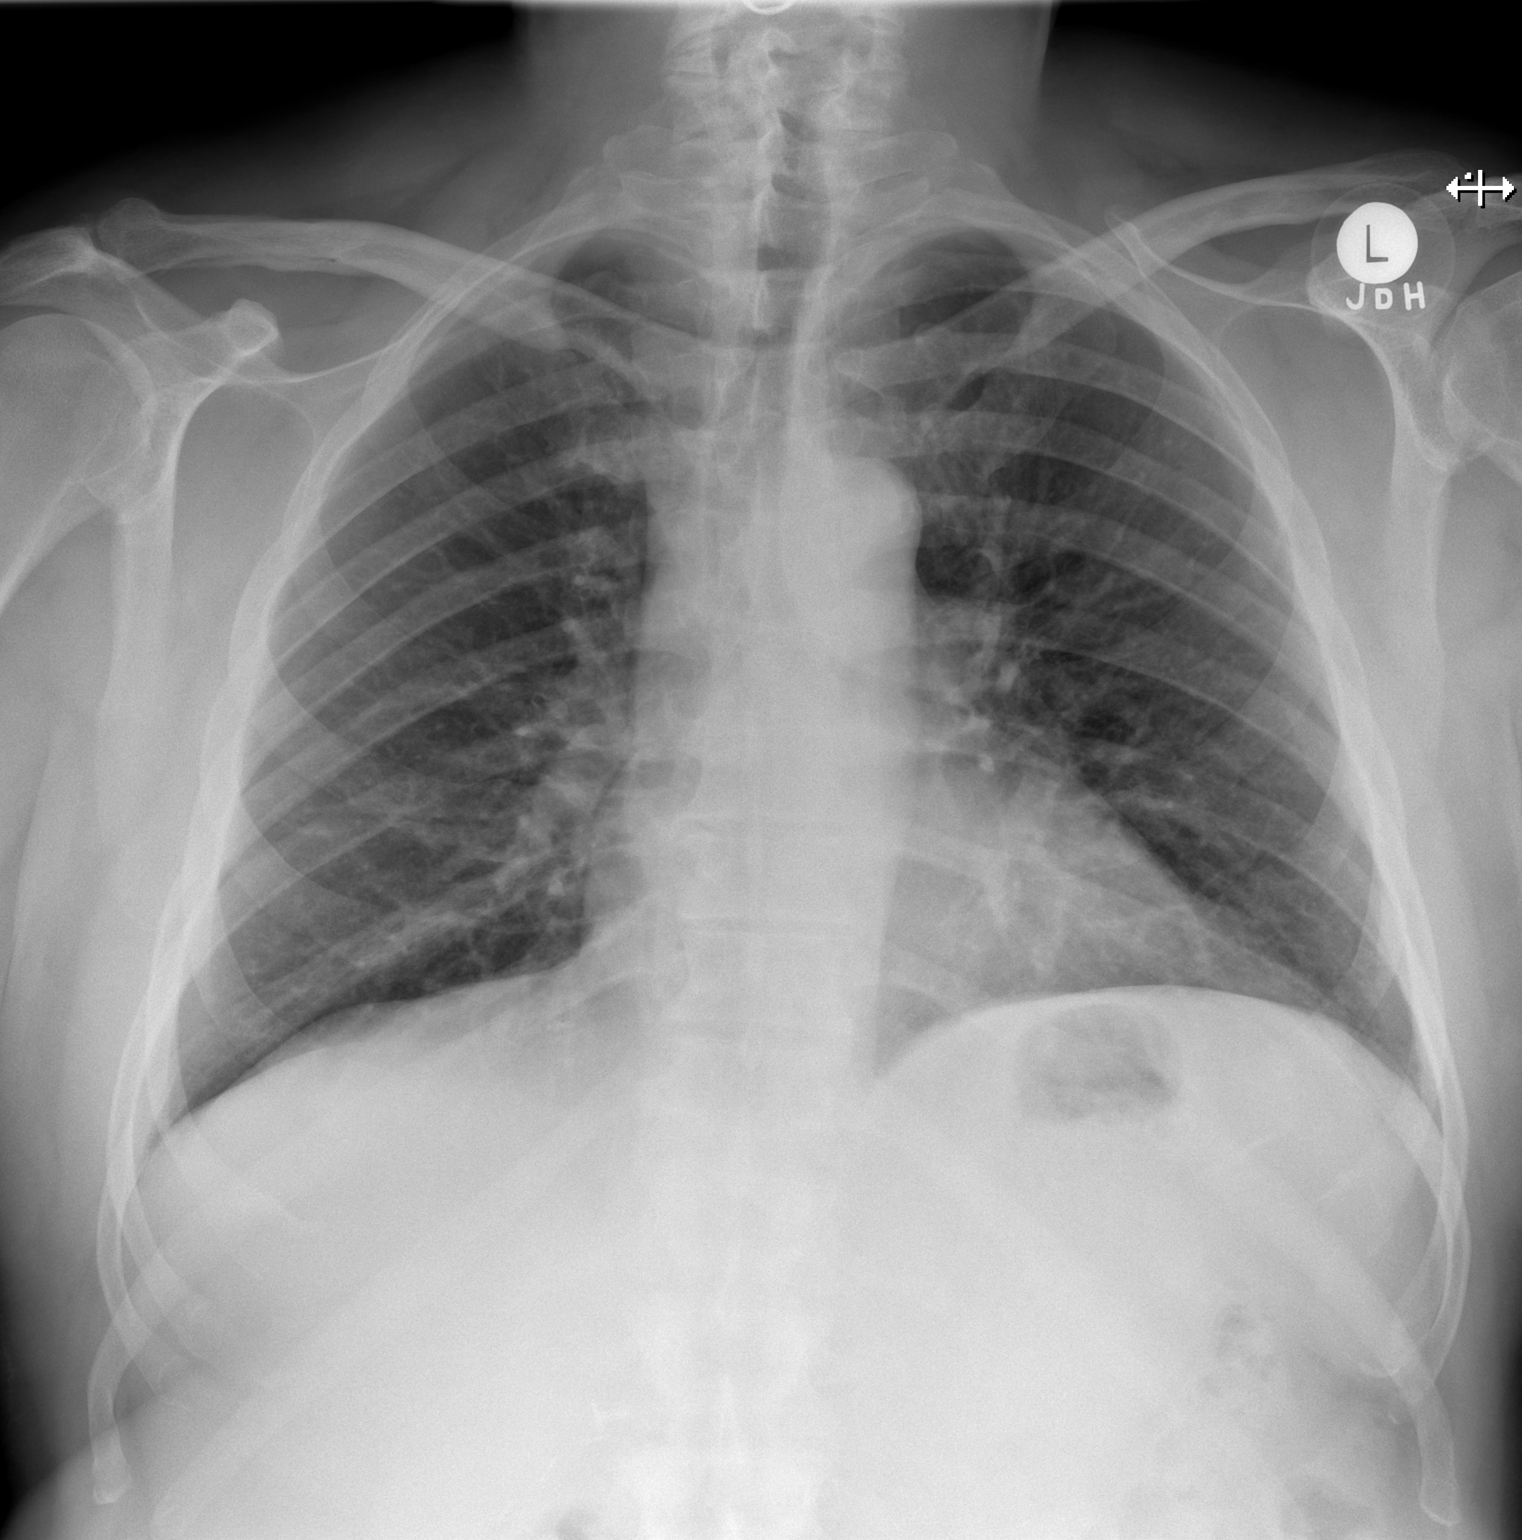

[w chest lat]
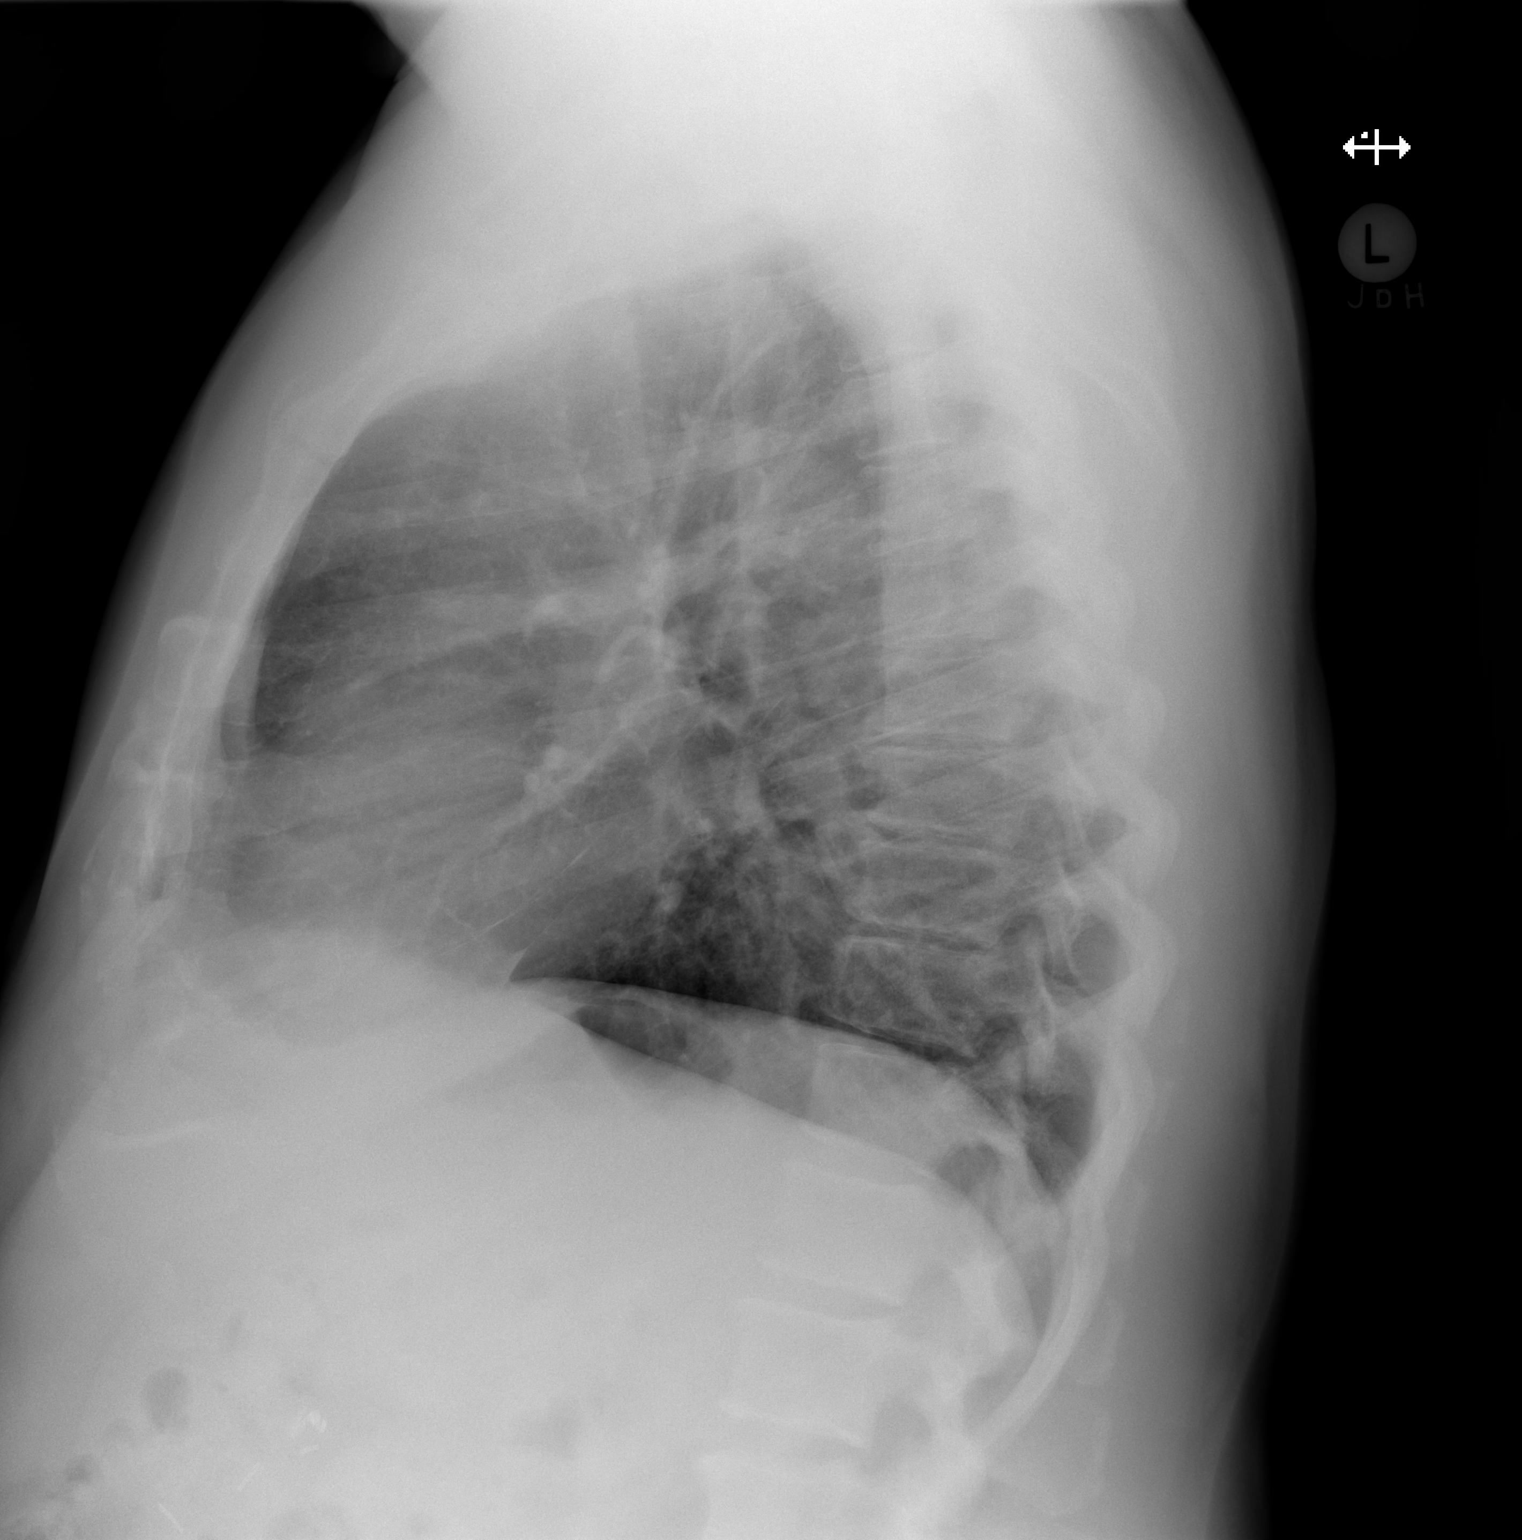

[2 of 2 positions shown; findings below may reference images not displayed]

FINDINGS: The heart size and mediastinal contours are within normal limits.
Both lungs are clear. The visualized skeletal structures are
unremarkable. Right upper quadrant cholecystectomy clips.
IMPRESSION: No active disease.

By: Mubarek Awel Masalah M.D.
